# Patient Record
Sex: Female | Born: 1995 | Race: White | Hispanic: No | Marital: Single | Smoking: Never smoker
Health system: Southern US, Community
[De-identification: ages and names within clinical notes are randomized; demographics above are authoritative.]

## PROBLEM LIST (undated history)

## (undated) DIAGNOSIS — J45909 Unspecified asthma, uncomplicated: Secondary | ICD-10-CM

## (undated) DIAGNOSIS — N946 Dysmenorrhea, unspecified: Secondary | ICD-10-CM

## (undated) DIAGNOSIS — R011 Cardiac murmur, unspecified: Secondary | ICD-10-CM

## (undated) DIAGNOSIS — N83299 Other ovarian cyst, unspecified side: Secondary | ICD-10-CM

## (undated) DIAGNOSIS — N912 Amenorrhea, unspecified: Secondary | ICD-10-CM

## (undated) DIAGNOSIS — T8859XA Other complications of anesthesia, initial encounter: Secondary | ICD-10-CM

## (undated) DIAGNOSIS — E039 Hypothyroidism, unspecified: Secondary | ICD-10-CM

## (undated) DIAGNOSIS — F419 Anxiety disorder, unspecified: Secondary | ICD-10-CM

## (undated) HISTORY — DX: Amenorrhea, unspecified: N91.2

## (undated) HISTORY — DX: Dysmenorrhea, unspecified: N94.6

## (undated) HISTORY — DX: Cardiac murmur, unspecified: R01.1

## (undated) HISTORY — DX: Anxiety disorder, unspecified: F41.9

## (undated) HISTORY — DX: Other ovarian cyst, unspecified side: N83.299

## (undated) HISTORY — DX: Unspecified asthma, uncomplicated: J45.909

---
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
## 2014-04-28 HISTORY — PX: APPENDECTOMY: SHX54

## 2019-02-21 ENCOUNTER — Other Ambulatory Visit: Payer: Self-pay

## 2019-02-23 ENCOUNTER — Encounter: Payer: BC Managed Care – PPO | Admitting: Obstetrics and Gynecology

## 2019-03-07 ENCOUNTER — Other Ambulatory Visit: Payer: Self-pay

## 2019-03-07 ENCOUNTER — Ambulatory Visit (INDEPENDENT_AMBULATORY_CARE_PROVIDER_SITE_OTHER): Payer: BC Managed Care – PPO | Admitting: Obstetrics and Gynecology

## 2019-03-07 ENCOUNTER — Encounter: Payer: Self-pay | Admitting: Obstetrics and Gynecology

## 2019-03-07 VITALS — BP 136/70 | HR 76 | Temp 97.6°F | Resp 16 | Ht 59.0 in | Wt 204.6 lb

## 2019-03-07 DIAGNOSIS — Z01419 Encounter for gynecological examination (general) (routine) without abnormal findings: Secondary | ICD-10-CM | POA: Diagnosis not present

## 2019-03-07 DIAGNOSIS — N941 Unspecified dyspareunia: Secondary | ICD-10-CM

## 2019-03-07 DIAGNOSIS — N760 Acute vaginitis: Secondary | ICD-10-CM | POA: Diagnosis not present

## 2019-03-07 DIAGNOSIS — Z8742 Personal history of other diseases of the female genital tract: Secondary | ICD-10-CM

## 2019-03-07 DIAGNOSIS — Z0000 Encounter for general adult medical examination without abnormal findings: Secondary | ICD-10-CM

## 2019-03-07 DIAGNOSIS — N946 Dysmenorrhea, unspecified: Secondary | ICD-10-CM

## 2019-03-07 LAB — POCT URINALYSIS DIPSTICK
Bilirubin, UA: NEGATIVE
Blood, UA: NEGATIVE
Glucose, UA: NEGATIVE
Ketones, UA: NEGATIVE
Leukocytes, UA: NEGATIVE
Nitrite, UA: NEGATIVE
Protein, UA: NEGATIVE
Urobilinogen, UA: 0.2 E.U./dL
pH, UA: 6 (ref 5.0–8.0)

## 2019-03-07 NOTE — Patient Instructions (Signed)

## 2019-03-07 NOTE — Progress Notes (Signed)
23 y.o. G1P0000 Married Caucasian female here for annual exam.    Patient has history of ovarian cysts and heavy menstrual cycles. She is also complaining of dyspareunia. She has a history of ruptured cysts and was dx with PID at one point.  Her cultures were negative.   She has constant sharp pain in her left side.  She picks up boxes at work, and this makes it worse.  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  She wants a note for work that she cannot do heavy lifting.   She tried birth control but she had a period every other week.  She stopped after 2 - 3 months.   She has painful menses.  She had heavy flow this time that lasted for 7 days.   Occasional skipped menses.   Thinks she may have a yeast infection.   Urine dip:  Neg.   PCP:  None   Patient's last menstrual period was 02/17/2019 (exact date).     Period Cycle (Days): 30 Period Duration (Days): 4-7 days Period Pattern: (!) Irregular Menstrual Flow: Heavy Menstrual Control: Tampon, Maxi pad Menstrual Control Change Freq (Hours): every 30 minutes to 1 hour Dysmenorrhea: (!) Severe Dysmenorrhea Symptoms: Diarrhea, Cramping   Sexually active: Yes.    The current method of family planning is condoms everytime.    Exercising: Yes.    walks daily Smoker:  no  Health Maintenance: Pap:  06/2017 Normal History of abnormal Pap:  no MMG:  n/a Colonoscopy:  n/a BMD:   n/a  Result  n/a TDaP: Unsure Gardasil:   Yes, completed HIV: 10/2018 Neg Hep C:10/2018 Neg Screening Labs:  PCP. Flu vaccine:  Completed last month.    reports that she has never smoked. She has never used smokeless tobacco. She reports that she does not drink alcohol or use drugs.  Past Medical History:  Diagnosis Date  . Amenorrhea   . Anxiety   . Asthma   . Dysmenorrhea   . Functional ovarian cysts   . Heart murmur    functional heart murmur--advised not to give blood    Past Surgical History:  Procedure  Laterality Date  . APPENDECTOMY  2016  . TONSILLECTOMY AND ADENOIDECTOMY  2001    Current Outpatient Medications  Medication Sig Dispense Refill  . albuterol (VENTOLIN HFA) 108 (90 Base) MCG/ACT inhaler Inhale 1 puff into the lungs as needed.    Marland Kitchen EPINEPHrine (EPIPEN 2-PAK) 0.3 mg/0.3 mL IJ SOAJ injection Inject 0.3 mg into the muscle as needed for anaphylaxis.    Marland Kitchen sertraline (ZOLOFT) 100 MG tablet Take 2 tablets by mouth daily.    . traMADol (ULTRAM) 50 MG tablet Take 50 mg by mouth every 6 (six) hours as needed.     No current facility-administered medications for this visit.     Family History  Problem Relation Age of Onset  . Thyroid disease Mother        hypothyroid  . Diabetes Father   . Diabetes Maternal Grandmother   . Cancer Maternal Grandfather 73       colon ca  . Ovarian cancer Paternal Grandmother 44       dec ovarian ca  . Diabetes Paternal Grandfather   . Heart attack Paternal Grandfather   . Breast cancer Other     Review of Systems  All other systems reviewed and are negative.   Exam:   BP 136/70 (Cuff Size: Large)   Pulse 76  Temp 97.6 F (36.4 C) (Temporal)   Resp 16   Ht 4\' 11"  (1.499 m)   Wt 204 lb 9.6 oz (92.8 kg)   LMP 02/17/2019 (Exact Date)   BMI 41.32 kg/m     General appearance: alert, cooperative and appears stated age Head: normocephalic, without obvious abnormality, atraumatic Neck: no adenopathy, supple, symmetrical, trachea midline and thyroid normal to inspection and palpation Lungs: clear to auscultation bilaterally Breasts: normal appearance, no masses or tenderness, No nipple retraction or dimpling, No nipple discharge or bleeding, No axillary adenopathy Heart: regular rate and rhythm Abdomen: soft, non-tender; no masses, no organomegaly Extremities: extremities normal, atraumatic, no cyanosis or edema Skin: skin color, texture, turgor normal. No rashes or lesions Lymph nodes: cervical, supraclavicular, and axillary nodes  normal. Neurologic: grossly normal  Pelvic: External genitalia:  no lesions              No abnormal inguinal nodes palpated.              Urethra:  normal appearing urethra with no masses, tenderness or lesions              Bartholins and Skenes: normal                 Vagina: normal appearing vagina with normal color and discharge, no lesions              Cervix: no lesions              Pap taken: No. Bimanual Exam:  Uterus:  normal size, contour, position, consistency, mobility, non-tender              Adnexa: no mass, fullness, tenderness on right.  Left adnexal tenderness without mass.               Rectal exam: Yes.  .  Confirms.              Anus:  normal sphincter tone, no lesions  Chaperone was present for exam.  Assessment:   Well woman visit with normal exam. Vaginitis. Dyspareunia.  Dysmenorrhea.  Hx ovarian cysts.   Plan: Mammogram screening discussed. Self breast awareness reviewed. Pap and HR HPV as above. Guidelines for Calcium, Vitamin D, regular exercise program including cardiovascular and weight bearing exercise. We discussed painful menses and ovarian cysts.  Nuswab. Return for pelvic 02/19/2019.  Follow up annually and prn.   After visit summary provided.

## 2019-03-08 LAB — NUSWAB VAGINITIS (VG)
Candida albicans, NAA: NEGATIVE
Candida glabrata, NAA: NEGATIVE
Trich vag by NAA: NEGATIVE

## 2019-03-09 ENCOUNTER — Telehealth: Payer: Self-pay | Admitting: Obstetrics and Gynecology

## 2019-03-09 NOTE — Telephone Encounter (Signed)
Call placed to patient to review benefit for recommended ultrasound. Left voicemail message requesting a return call °

## 2019-03-10 NOTE — Telephone Encounter (Signed)
Second call placed to patient to scheduled recommended ultrasound. Left voicemail message requesting a return call

## 2019-03-22 ENCOUNTER — Other Ambulatory Visit: Payer: Self-pay

## 2019-03-22 DIAGNOSIS — Z20822 Contact with and (suspected) exposure to covid-19: Secondary | ICD-10-CM

## 2019-03-22 NOTE — Telephone Encounter (Signed)
Ok to close order for ultrasound.

## 2019-03-22 NOTE — Telephone Encounter (Signed)
Third call placed to patient to review benefit and schedule recommended ultrasound. Left voicemail message requesting a return call   cc: Dr Quincy Simmonds

## 2019-03-23 LAB — NOVEL CORONAVIRUS, NAA: SARS-CoV-2, NAA: NOT DETECTED

## 2019-03-28 NOTE — Telephone Encounter (Signed)
Call placed to patient. Reviewed benefit for recommended ultrasound.  Patient acknowledges understanding of information presented. Patient is scheduled 03/31/2019 with Dr Quincy Simmonds. Patient is aware of the appointment date, arrival time and cancellation policy. No further questions. Will close encounter

## 2019-03-31 ENCOUNTER — Other Ambulatory Visit: Payer: BC Managed Care – PPO | Admitting: Obstetrics and Gynecology

## 2019-03-31 ENCOUNTER — Other Ambulatory Visit: Payer: BC Managed Care – PPO

## 2019-04-04 ENCOUNTER — Telehealth: Payer: Self-pay | Admitting: Obstetrics and Gynecology

## 2019-04-04 NOTE — Telephone Encounter (Signed)
Patient cancelled ultrasound for Thursday 12/10 because of work schedule. Would like a call to reschedule.

## 2019-04-04 NOTE — Telephone Encounter (Signed)
Attempted to call back pt to r/s PUS. Pts voicemail box full and cannot leave message. Will try again.

## 2019-04-05 NOTE — Telephone Encounter (Signed)
Spoke to pt. Pt needing to r/s PUS due to work schedule and pt cannot be seen until Jan 2021. PUS/OV rescheduled on 05/12/2019 at 8:30am with Dr Quincy Simmonds. Pt agreeable.   Will route to Dr Quincy Simmonds for review and close encounter.   Cc: Deloris Ping, orders in place, precert moved from 62/86 and pt aware of PR.

## 2019-04-07 ENCOUNTER — Other Ambulatory Visit: Payer: BC Managed Care – PPO

## 2019-04-07 ENCOUNTER — Other Ambulatory Visit: Payer: BC Managed Care – PPO | Admitting: Obstetrics and Gynecology

## 2019-04-29 DIAGNOSIS — U071 COVID-19: Secondary | ICD-10-CM

## 2019-04-29 HISTORY — DX: COVID-19: U07.1

## 2019-05-09 ENCOUNTER — Telehealth: Payer: Self-pay | Admitting: Obstetrics and Gynecology

## 2019-05-09 NOTE — Telephone Encounter (Signed)
Patient is calling to move time of [place] appointment on Thursday.

## 2019-05-09 NOTE — Telephone Encounter (Signed)
Spoke with patient. Patient request to change the time of PUS scheduled for 05/12/19 d/t her work schedule. PUS r/s to 05/12/19 at 11am, consult at 11:30am. Patient verbalizes understanding and is agreeable.   Routing to provider for final review. Patient is agreeable to disposition. Will close encounter.

## 2019-05-10 ENCOUNTER — Other Ambulatory Visit: Payer: Self-pay

## 2019-05-12 ENCOUNTER — Ambulatory Visit: Payer: BC Managed Care – PPO | Admitting: Obstetrics and Gynecology

## 2019-05-12 ENCOUNTER — Other Ambulatory Visit: Payer: Self-pay

## 2019-05-12 ENCOUNTER — Ambulatory Visit: Payer: Self-pay | Admitting: Obstetrics and Gynecology

## 2019-05-12 ENCOUNTER — Encounter: Payer: Self-pay | Admitting: Obstetrics and Gynecology

## 2019-05-12 ENCOUNTER — Ambulatory Visit (INDEPENDENT_AMBULATORY_CARE_PROVIDER_SITE_OTHER): Payer: BC Managed Care – PPO

## 2019-05-12 VITALS — BP 108/70 | HR 56 | Temp 97.1°F | Ht 59.0 in | Wt 203.8 lb

## 2019-05-12 DIAGNOSIS — Z8742 Personal history of other diseases of the female genital tract: Secondary | ICD-10-CM | POA: Diagnosis not present

## 2019-05-12 DIAGNOSIS — N946 Dysmenorrhea, unspecified: Secondary | ICD-10-CM | POA: Diagnosis not present

## 2019-05-12 DIAGNOSIS — N920 Excessive and frequent menstruation with regular cycle: Secondary | ICD-10-CM | POA: Diagnosis not present

## 2019-05-12 DIAGNOSIS — N941 Unspecified dyspareunia: Secondary | ICD-10-CM

## 2019-05-12 MED ORDER — LEVONORGEST-ETH ESTRAD 91-DAY 0.15-0.03 MG PO TABS: 1.0000 | ORAL_TABLET | Freq: Every day | ORAL | 0 refills | Status: DC

## 2019-05-12 NOTE — Progress Notes (Signed)
Encounter reviewed by Dr. Keamber Macfadden Amundson C. Silva.  

## 2019-05-12 NOTE — Patient Instructions (Signed)
Endometriosis  Endometriosis is a condition in which the tissue that lines the uterus (endometrium) grows outside of its normal location. The tissue may grow in many locations close to the uterus, but it commonly grows on the ovaries, fallopian tubes, vagina, or bowel. When the uterus sheds the endometrium every menstrual cycle, there is bleeding wherever the endometrial tissue is located. This can cause pain because blood is irritating to tissues that are not normally exposed to it. What are the causes? The cause of endometriosis is not known. What increases the risk? You may be more likely to develop endometriosis if you:  Have a family history of endometriosis.  Have never given birth.  Started your period at age 10 or younger.  Have high levels of estrogen in your body.  Were exposed to a certain medicine (diethylstilbestrol) before you were born (in utero).  Had low birth weight.  Were born as a twin, triplet, or other multiple.  Have a BMI of less than 25. BMI is an estimate of body fat and is calculated from height and weight. What are the signs or symptoms? Often, there are no symptoms of this condition. If you do have symptoms, they may:  Vary depending on where your endometrial tissue is growing.  Occur during your menstrual period (most common) or midcycle.  Come and go, or you may go months with no symptoms at all.  Stop with menopause. Symptoms may include:  Pain in the back or abdomen.  Heavier bleeding during periods.  Pain during sex.  Painful bowel movements.  Infertility.  Pelvic pain.  Bleeding more than once a month. How is this diagnosed? This condition is diagnosed based on your symptoms and a physical exam. You may have tests, such as:  Blood tests and urine tests. These may be done to help rule out other possible causes of your symptoms.  Ultrasound, to look for abnormal tissues.  An X-ray of the lower bowel (barium enema).  An  ultrasound that is done through the vagina (transvaginally).  CT scan.  MRI.  Laparoscopy. In this procedure, a lighted, pencil-sized instrument called a laparoscope is inserted into your abdomen through an incision. The laparoscope allows your health care provider to look at the organs inside your body and check for abnormal tissue to confirm the diagnosis. If abnormal tissue is found, your health care provider may remove a small piece of tissue (biopsy) to be examined under a microscope. How is this treated? Treatment for this condition may include:  Medicines to relieve pain, such as NSAIDs.  Hormone therapy. This involves using artificial (synthetic) hormones to reduce endometrial tissue growth. Your health care provider may recommend using a hormonal form of birth control, or other medicines.  Surgery. This may be done to remove abnormal endometrial tissue. ? In some cases, tissue may be removed using a laparoscope and a laser (laparoscopic laser treatment). ? In severe cases, surgery may be done to remove the fallopian tubes, uterus, and ovaries (hysterectomy). Follow these instructions at home:  Take over-the-counter and prescription medicines only as told by your health care provider.  Do not drive or use heavy machinery while taking prescription pain medicine.  Try to avoid activities that cause pain, including sexual activity.  Keep all follow-up visits as told by your health care provider. This is important. Contact a health care provider if:  You have pain in the area between your hip bones (pelvic area) that occurs: ? Before, during, or after your period. ?   In between your period and gets worse during your period. ? During or after sex. ? With bowel movements or urination, especially during your period.  You have problems getting pregnant.  You have a fever. Get help right away if:  You have severe pain that does not get better with medicine.  You have severe  nausea and vomiting, or you cannot eat without vomiting.  You have pain that affects only the lower, right side of your abdomen.  You have abdominal pain that gets worse.  You have abdominal swelling.  You have blood in your stool. This information is not intended to replace advice given to you by your health care provider. Make sure you discuss any questions you have with your health care provider. Document Revised: 03/27/2017 Document Reviewed: 09/15/2015 Elsevier Patient Education  2020 [employer]. Ethinyl Estradiol; Levonorgestrel tablets What is this medicine? ETHINYL ESTRADIOL; LEVONORGESTREL (ETH in il es tra DYE ole; LEE voh nor jes trel) is an oral contraceptive. It combines two types of female hormones, an estrogen and a progestin. They are used to prevent ovulation and pregnancy. This medicine may be used for other purposes; ask your health care provider or pharmacist if you have questions. COMMON BRAND NAME(S): [place], [place], [place], [place], Amethia Lo, [place], [place], [place], [place], [place], [place], Cam[place], [place], [place], [place], [address], [place], [place], [place], [place], [place], [place], [place], [place], [place], [place], [place], [place], Levonorgestrel/Ethinyl Estradiol, [place], [place], [place], [place], [place], [place], [place], [place], [place], [place], [place], [place], [place], [place], [place], [place], [place], [place], [place], [place], [place], [place] What should I tell my health care provider before I take this medicine? They need to know if you have or ever had any of these conditions:  abnormal vaginal bleeding  blood vessel disease or blood clots  breast, cervical, endometrial, ovarian, liver, or uterine cancer  diabetes  gallbladder disease  heart disease or recent heart attack  high blood pressure  high cholesterol  kidney disease  liver disease  migraine headaches  stroke  systemic  lupus erythematosus (SLE)  tobacco smoker  an unusual or allergic reaction to estrogens, progestins, other medicines, foods, dyes, or preservatives  pregnant or trying to get pregnant  breast-feeding How should I use this medicine? Take this medicine by mouth. To reduce nausea, this medicine may be taken with food. Follow the directions on the prescription label. Take this medicine at the same time each day and in the order directed on the package. Do not take your medicine more often than directed. Contact your pediatrician regarding the use of this medicine in children. Special care may be needed. This medicine has been used in female children who have started having menstrual periods. A patient package insert for the product will be given with each prescription and refill. Read this sheet carefully each time. The sheet may change frequently. Overdosage: If you think you have taken too much of this medicine contact a poison control center or emergency room at once. NOTE: This medicine is only for you. Do not share this medicine with others. What if I miss a dose? If you miss a dose, refer to the patient information sheet you received with your medicine for direction. If you miss more than one pill, this medicine may not be as effective and you may need to use another form of birth control. What may interact with this medicine? Do not take this medicine with the following medication:  dasabuvir; ombitasvir; paritaprevir; ritonavir  ombitasvir; paritaprevir; ritonavir This medicine may  also interact with the following medications:  acetaminophen  antibiotics or medicines for infections, especially rifampin, rifabutin, rifapentine, and griseofulvin, and possibly penicillins or tetracyclines  aprepitant  ascorbic acid (vitamin C)  atorvastatin  barbiturate medicines, such as  phenobarbital  bosentan  carbamazepine  caffeine  clofibrate  cyclosporine  dantrolene  doxercalciferol  felbamate  grapefruit juice  hydrocortisone  medicines for anxiety or sleeping problems, such as diazepam or temazepam  medicines for diabetes, including pioglitazone  mineral oil  modafinil  mycophenolate  nefazodone  oxcarbazepine  phenytoin  prednisolone  ritonavir or other medicines for HIV infection or AIDS  rosuvastatin  selegiline  soy isoflavones supplements  St. John's wort  tamoxifen or raloxifene  theophylline  thyroid hormones  topiramate  warfarin This list may not describe all possible interactions. Give your health care provider a list of all the medicines, herbs, non-prescription drugs, or dietary supplements you use. Also tell them if you smoke, drink alcohol, or use illegal drugs. Some items may interact with your medicine. What should I watch for while using this medicine? Visit your doctor or health care professional for regular checks on your progress. You will need a regular breast and pelvic exam and Pap smear while on this medicine. Use an additional method of contraception during the first cycle that you take these tablets. If you have any reason to think you are pregnant, stop taking this medicine right away and contact your doctor or health care professional. If you are taking this medicine for hormone related problems, it may take several cycles of use to see improvement in your condition. Smoking increases the risk of getting a blood clot or having a stroke while you are taking birth control pills, especially if you are more than 26 years old. You are strongly advised not to smoke. This medicine can make your body retain fluid, making your fingers, hands, or ankles swell. Your blood pressure can go up. Contact your doctor or health care professional if you feel you are retaining fluid. This medicine can make you more  sensitive to the sun. Keep out of the sun. If you cannot avoid being in the sun, wear protective clothing and use sunscreen. Do not use sun lamps or tanning beds/booths. If you wear contact lenses and notice visual changes, or if the lenses begin to feel uncomfortable, consult your eye care specialist. In some women, tenderness, swelling, or minor bleeding of the gums may occur. Notify your dentist if this happens. Brushing and flossing your teeth regularly may help limit this. See your dentist regularly and inform your dentist of the medicines you are taking. If you are going to have elective surgery, you may need to stop taking this medicine before the surgery. Consult your health care professional for advice. This medicine does not protect you against HIV infection (AIDS) or any other sexually transmitted diseases. What side effects may I notice from receiving this medicine? Side effects that you should report to your doctor or health care professional as soon as possible:  breast tissue changes or discharge  changes in vaginal bleeding during your period or between your periods  chest pain  coughing up blood  dizziness or fainting spells  headaches or migraines  leg, arm or groin pain  severe or sudden headaches  stomach pain (severe)  sudden shortness of breath  sudden loss of coordination, especially on one side of the body  speech problems  symptoms of vaginal infection like itching, irritation or unusual discharge  tenderness in the upper abdomen  vomiting  weakness or numbness in the arms or legs, especially on one side of the body  yellowing of the eyes or skin Side effects that usually do not require medical attention (report to your doctor or health care professional if they continue or are bothersome):  breakthrough bleeding and spotting that continues beyond the 3 initial cycles of pills  breast tenderness  mood changes, anxiety, depression, frustration,  anger, or emotional outbursts  increased sensitivity to sun or ultraviolet light  nausea  skin rash, acne, or brown spots on the skin  weight gain (slight) This list may not describe all possible side effects. Call your doctor for medical advice about side effects. You may report side effects to FDA at 1-800-FDA-1088. Where should I keep my medicine? Keep out of the reach of children. Store at room temperature between 15 and 30 degrees C (59 and 86 degrees F). Throw away any unused medicine after the expiration date. NOTE: This sheet is a summary. It may not cover all possible information. If you have questions about this medicine, talk to your doctor, pharmacist, or health care provider.  2020 Elsevier/Gold Standard (2015-12-24 07:58:22)

## 2019-05-12 NOTE — Progress Notes (Signed)
GYNECOLOGY  VISIT   HPI: 24 y.o.   Married  Caucasian  female   G0P0000 with Patient's last menstrual period was 04/22/2019 (exact date).   here for pelvic ultrasound.   Heavy menstrual cycles with ongoing dysmenorrhea and dyspareunia for 3 months and not every time she has intercourse. Hx ovarian cysts.  Changing pad and tampon every 45 minutes to one hour.   Recently having left sided pain, increased with activity.  Always having left sided pressure.   No constipation or blood in her urine.  She has some back pain.   She has used birth control pills in past.  Denies HTN, migraine aura, liver or breast disease, personal or FH of thromboembolic events.   GYNECOLOGIC HISTORY: Patient's last menstrual period was 04/22/2019 (exact date). Contraception: condoms everytime Menopausal hormone therapy: none Last mammogram:  n/a Last pap smear: 06/2017 normal        OB History    Gravida  0   Para  0   Term  0   Preterm  0   AB  0   Living  0     SAB  0   TAB  0   Ectopic  0   Multiple  0   Live Births  0              There are no problems to display for this patient.   Past Medical History:  Diagnosis Date  . Amenorrhea   . Anxiety   . Asthma   . Dysmenorrhea   . Functional ovarian cysts   . Heart murmur    functional heart murmur--advised not to give blood    Past Surgical History:  Procedure Laterality Date  . APPENDECTOMY  2016  . TONSILLECTOMY AND ADENOIDECTOMY  2001    Current Outpatient Medications  Medication Sig Dispense Refill  . albuterol (VENTOLIN HFA) 108 (90 Base) MCG/ACT inhaler Inhale 1 puff into the lungs as needed.    Marland Kitchen EPINEPHrine (EPIPEN 2-PAK) 0.3 mg/0.3 mL IJ SOAJ injection Inject 0.3 mg into the muscle as needed for anaphylaxis.    Marland Kitchen sertraline (ZOLOFT) 100 MG tablet Take 2 tablets by mouth daily.     No current facility-administered medications for this visit.     ALLERGIES: Peanut oil, Penicillins, Latex, and  Other  Family History  Problem Relation Age of Onset  . Thyroid disease Mother        hypothyroid  . Diabetes Father   . Diabetes Maternal Grandmother   . Cancer Maternal Grandfather 31       colon ca  . Ovarian cancer Paternal Grandmother 44       dec ovarian ca  . Diabetes Paternal Grandfather   . Heart attack Paternal Grandfather   . Breast cancer Other     Social History   Socioeconomic History  . Marital status: Married    Spouse name: Not on file  . Number of children: Not on file  . Years of education: Not on file  . Highest education level: Not on file  Occupational History  . Not on file  Tobacco Use  . Smoking status: Never Smoker  . Smokeless tobacco: Never Used  Substance and Sexual Activity  . Alcohol use: Never  . Drug use: Never  . Sexual activity: Yes    Birth control/protection: Condom    Comment: condoms everytime  Other Topics Concern  . Not on file  Social History Narrative  . Not on file  Social Determinants of Health   Financial Resource Strain:   . Difficulty of Paying Living Expenses: Not on file  Food Insecurity:   . Worried About Programme researcher, broadcasting/film/video in the Last Year: Not on file  . Ran Out of Food in the Last Year: Not on file  Transportation Needs:   . Lack of Transportation (Medical): Not on file  . Lack of Transportation (Non-Medical): Not on file  Physical Activity:   . Days of Exercise per Week: Not on file  . Minutes of Exercise per Session: Not on file  Stress:   . Feeling of Stress : Not on file  Social Connections:   . Frequency of Communication with Friends and Family: Not on file  . Frequency of Social Gatherings with Friends and Family: Not on file  . Attends Religious Services: Not on file  . Active Member of Clubs or Organizations: Not on file  . Attends Banker Meetings: Not on file  . Marital Status: Not on file  Intimate Partner Violence:   . Fear of Current or Ex-Partner: Not on file  .  Emotionally Abused: Not on file  . Physically Abused: Not on file  . Sexually Abused: Not on file    Review of Systems  All other systems reviewed and are negative.   PHYSICAL EXAMINATION:    BP 108/70 (Cuff Size: Large)   Pulse (!) 56 Comment: irregular  Temp (!) 97.1 F (36.2 C) (Temporal)   Ht 4\' 11"  (1.499 m)   Wt 203 lb 12.8 oz (92.4 kg)   LMP 04/22/2019 (Exact Date)   BMI 41.16 kg/m     General appearance: alert, cooperative and appears stated age   Pelvic 04/24/2019 Uterus no masses. EMS 10.64 mm.  Small left CL.  Right ovary normal. No free fluid.  ASSESSMENT  Dysmenorrhea.  Dyspareunia.  Menorrhagia.   PLAN  We discussed her pain and heavy menstrual bleeding and reassuring US findings.  Endometriosis reviewed as possible etiology.  Reassuring US findings conveyed to patient.  Will start Seasonale.  Instructed in use.  FU in 3 months.    An After Visit Summary was printed and given to the patient.  __20____ minutes face to face time of which over 50% was spent in counseling.

## 2019-05-30 ENCOUNTER — Telehealth: Payer: Self-pay | Admitting: Obstetrics and Gynecology

## 2019-05-30 NOTE — Telephone Encounter (Signed)
Spoke with patient. Patient started new OCP 1 wk ago, reports nausea and vomiting after taking medication. She works 3rd shift, takes the medication after work in the morning, takes with food. Has taken OCP in the past with no problems.   Patient asking if ok to take dramamine or other alternative Rx to help with nausea as her body adjust?   Advised nausea typically resolves with use. If nausea does not resolve, need to return call to advise, may need to discuss alternative contraceptive. Advised I will review with Dr. Edward Jolly and return call with additional recommendations. Patient agreeable.  Routing to Dr. Edward Jolly to review.

## 2019-05-30 NOTE — Telephone Encounter (Signed)
Patient is nauseas since staring birth control. She is asking for an a prescription to help this problem.

## 2019-05-30 NOTE — Telephone Encounter (Signed)
Spoke with patient. Advised per Dr. Edward Jolly. Patient declines new Rx at this time. She would like to give current RX a little more time. Patient will return call to office for new Rx if nausea does not resolve.   Routing to provider for final review. Patient is agreeable to disposition. Will close encounter.

## 2019-05-30 NOTE — Telephone Encounter (Signed)
If she is really concerned about the nausea, she can switch to [facility], which is a low dose version of Seasonale.  This pill also gives a small dose of estrogen during the period week which can help some women to avoid menstrual headaches, if they suffer from that.

## 2019-06-06 ENCOUNTER — Telehealth: Payer: Self-pay | Admitting: Obstetrics and Gynecology

## 2019-06-06 NOTE — Telephone Encounter (Signed)
I agree with your recommendations.  Keep 3 month recheck.

## 2019-06-06 NOTE — Telephone Encounter (Signed)
Spoke to pt. Pt states having cramps and spotting( light brown) on Friday and Monday. Pt was SA on Friday. Pt denies any missed or skipped pills. Pt states not taking at same time every day. Discussed plan to take when she can stay on a schedule and take every day. Pt agreeable. Will give new OCPs and pill pack that she started on 05/26/2019 a little more time. Pt denies nausea from update on 05/30/2019 phone encounter. Pt agreeable. Pt has 3 month f/u on OCPs on 08/10/2019.   Routing to Dr Edward Jolly for any additional recommendations or advice.

## 2019-06-06 NOTE — Telephone Encounter (Signed)
Patient is calling regarding irregular bleeding on OCP.

## 2019-06-06 NOTE — Telephone Encounter (Signed)
Encounter closed

## 2019-06-15 ENCOUNTER — Telehealth: Payer: Self-pay | Admitting: Obstetrics and Gynecology

## 2019-06-15 NOTE — Telephone Encounter (Signed)
Spoke with patient. Patient reports constant sharp, burning pain in lower abdomen/pelvis, "more severe last night", currently 6/10. Not relieved with motrin.  Nausea, no vomiting. Spotting 2 days ago and with intercourse. Started seasonale last month, just completed 1st pack. Has had to leave work x3 due to pain. Denies fever/chills.  Patient is requesting FMLA.   Advised patient she would need an OV for further evaluation, would further discuss tx options and plan of care with Dr. Edward Jolly.   Patient reports runny nose and congestion for 2 days, taking a decongestant. Advised patient because of current Covid 19 restrictions she will need to be further evaluated by PCP or Covid 19 testing prior to OV. Patient request MyChart visit, advised patient she will need to be seen in person for appropriate evaluation of symptoms. Will review with Dr. Edward Jolly and return call. Patient is agreeable.

## 2019-06-15 NOTE — Telephone Encounter (Signed)
Patient left her work yesterday due to pain with her endometriosis. She is asking if Dr.Silva would authorize FMLA for her?

## 2019-06-15 NOTE — Telephone Encounter (Signed)
Call reviewed with Dr. Edward Jolly, agreeable to plan.   Call returned to patient, advised as seen above. Patient states she is now scheduled to see her PCP 2/18. She will return call to office for appt.   Routing to provider for final review. Patient is agreeable to disposition. Will close encounter.

## 2019-08-08 ENCOUNTER — Telehealth: Payer: Self-pay | Admitting: Obstetrics and Gynecology

## 2019-08-08 NOTE — Telephone Encounter (Signed)
Call patient regarding cancelled appointment. No answer and mailbox is full. °

## 2019-08-10 ENCOUNTER — Other Ambulatory Visit: Payer: Self-pay

## 2019-08-10 ENCOUNTER — Encounter: Payer: Self-pay | Admitting: Obstetrics & Gynecology

## 2019-08-10 ENCOUNTER — Ambulatory Visit: Payer: BC Managed Care – PPO | Admitting: Obstetrics and Gynecology

## 2019-08-10 ENCOUNTER — Ambulatory Visit: Payer: BC Managed Care – PPO | Admitting: Obstetrics & Gynecology

## 2019-08-10 VITALS — BP 122/70 | HR 80 | Temp 97.9°F | Resp 10 | Ht 59.0 in | Wt 204.0 lb

## 2019-08-10 DIAGNOSIS — N941 Unspecified dyspareunia: Secondary | ICD-10-CM

## 2019-08-10 DIAGNOSIS — N946 Dysmenorrhea, unspecified: Secondary | ICD-10-CM

## 2019-08-10 MED ORDER — LEVONORGEST-ETH ESTRAD 91-DAY 0.15-0.03 MG PO TABS: 1.0000 | ORAL_TABLET | Freq: Every day | ORAL | 2 refills | Status: DC

## 2019-08-10 NOTE — Progress Notes (Signed)
GYNECOLOGY  VISIT  CC:   Patient complains of still having pain with and without periods and abdominal swelling  HPI: 24 y.o. G0P0000 Married White or Caucasian female here for 3 month OCP recheck.  She's been followed by Dr. Quincy Simmonds for dysmenorrhea and possible endometriosis.  She had a normal ultrasound 05/12/2019.  She was started on seasonale and reports she's done well taking the medication but did have a lot of irregular bleeding the first month.  She passed tissue and clots with some of the bleeding.  She did have a cycle last month and missed two weeks of work due to pelvic pain at that time.  Pain is typically located in the LLQ.  Reports she always has dull pain but it was much more severe in march.  The dull pain is more like a "burning pain" but when it is severe it is sharper in nature.  She also has a lot of bloating when she has pain.     Does not have constipation or diarrhea.  Has bowel movement every day.  Does not have pain with bowel movements.    Pt has hx of painful intercourse as well but did not this was improved the last month.  This was a nice surprise to her.  She is not currently interested in childbearing but does want to be able to consider this in the future.  Her boyfriend would like kids as well.    Pt is ready to proceed with additional step of evaluation.  She knows Dr. Quincy Simmonds is not available today but is ready to proceed with laparoscopy.  We discussed this surgical procedure in the evaluation for endometriosis including risks and benefits.  Need for tissue biopsy discussed.  As well, discussed need for treatment after procedure if endometriosis is present.  Will need to wait until Dr. Quincy Simmonds is back to proceed with any scheduling.    Pt does want to stay on the OCP for now.    GYNECOLOGIC HISTORY: Patient's last menstrual period was 06/19/2019 (within days). Contraception: Seasonale Menopausal hormone therapy: none  There are no problems to display for this  patient.   Past Medical History:  Diagnosis Date  . Amenorrhea   . Anxiety   . Asthma   . Dysmenorrhea   . Functional ovarian cysts   . Heart murmur    functional heart murmur--advised not to give blood    Past Surgical History:  Procedure Laterality Date  . APPENDECTOMY  2016  . TONSILLECTOMY AND ADENOIDECTOMY  2001    MEDS:   Current Outpatient Medications on File Prior to Visit  Medication Sig Dispense Refill  . albuterol (VENTOLIN HFA) 108 (90 Base) MCG/ACT inhaler Inhale 1 puff into the lungs as needed.    Marland Kitchen EPINEPHrine (EPIPEN 2-PAK) 0.3 mg/0.3 mL IJ SOAJ injection Inject 0.3 mg into the muscle as needed for anaphylaxis.    Marland Kitchen levonorgestrel-ethinyl estradiol (SEASONALE) 0.15-0.03 MG tablet Take 1 tablet by mouth daily. 1 Package 0  . sertraline (ZOLOFT) 100 MG tablet Take 2 tablets by mouth daily.     No current facility-administered medications on file prior to visit.    ALLERGIES: Peanut oil, Penicillins, Latex, and Other  Family History  Problem Relation Age of Onset  . Thyroid disease Mother        hypothyroid  . Diabetes Father   . Diabetes Maternal Grandmother   . Cancer Maternal Grandfather 73       colon ca  . Ovarian cancer  Paternal Grandmother 101       dec ovarian ca  . Diabetes Paternal Grandfather   . Heart attack Paternal Grandfather   . Breast cancer Other     SH:  Single, non smoker  Review of Systems  All other systems reviewed and are negative.   PHYSICAL EXAMINATION:    BP 122/70 (BP Location: Left Arm, Patient Position: Sitting, Cuff Size: Normal)   Pulse 80   Temp 97.9 F (36.6 C) (Temporal)   Resp 10   Ht 4\' 11"  (1.499 m)   Wt 204 lb (92.5 kg)   LMP 06/19/2019 (Within Days)   BMI 41.20 kg/m     General appearance: alert, cooperative and appears stated age  Assessment: Pelvic pain Dysmenorrhea  Plan: Continue Seasonale.  rx to pharmacy. Consider proceeding with laparoscopy for further evaluation.  Will wait until Dr.  06/21/2019 returns.  About 30 minutes spent with pt in face to face discussion.

## 2019-08-17 ENCOUNTER — Telehealth: Payer: Self-pay | Admitting: Obstetrics and Gynecology

## 2019-08-17 NOTE — Telephone Encounter (Signed)
Spoke with patient regarding surgery benefits. Patient acknowledges understanding of information presented. Patient is aware that benefits presented are for professional benefits only. Patient is aware that once surgery is scheduled, the hospital will call with separate benefits. Patient is aware of surgery cancellation policy.  Patient stated that she would speak with her employer this evening and call tomorrow to proceed with scheduling. Patient aware to ask to speak to [facility] regarding scheduling.

## 2019-08-29 ENCOUNTER — Encounter: Payer: Self-pay | Admitting: Obstetrics and Gynecology

## 2019-08-29 NOTE — Telephone Encounter (Addendum)
Call to patient to follow up regarding scheduling surgery. Patient stated that she tested positive for COVID-19 on 09/22/2019. Patient stated that she has asthma and has had a really hard time with COVID symptoms. Patient stated that she has another COVID-19 test scheduled for 09/09/2019 before returning to work. Patient requests a call back on 09/12/2019 to see how she is feeling and how she would like to proceed. Patient still wants to proceed with surgery, but is unsure about time frame at this time. Patient stated that she wants Dr. Edward Jolly to be aware that when she has been put under anesthesia in the past, she has had issues with her breathing as she comes out of sedation. Informed patient that I would let Dr. Edward Jolly know and would return call on 09/12/2019. Patient is agreeable.  Routing to Dr. Edward Jolly and Yvonna Alanis, RN.

## 2019-08-29 NOTE — Telephone Encounter (Signed)
I recommend patient follow up with her PCP or maybe a pulmonologist if she is having respiratory difficulties.  For surgery preparation, I would recommend an anesthesia consultation.   Cc - Rolly Salter Carder

## 2019-09-07 NOTE — Telephone Encounter (Signed)
Call to patient, no answer, unable to leave message.

## 2019-09-13 NOTE — Telephone Encounter (Signed)
Call to patient, no answer, unable to leave message.

## 2019-09-15 NOTE — Telephone Encounter (Signed)
Dr.Silva, we have been unable to reach this patient. Please advise.

## 2019-09-16 NOTE — Telephone Encounter (Signed)
OK to wait for patient to contact the office back when she is ready to proceed.  The indication for surgery is pain.   You may close the encounter.

## 2019-10-17 ENCOUNTER — Telehealth: Payer: Self-pay

## 2019-10-17 NOTE — Telephone Encounter (Signed)
Patient is calling for questions regarding surgery and insurance.

## 2019-10-18 NOTE — Telephone Encounter (Signed)
Left message to return call to Hayley at [phone] 

## 2019-10-19 NOTE — Telephone Encounter (Signed)
Patient is calling regarding proceeding with surgery plan. Informed patient that benefits would need to be checked again. Patient is agreeable and aware to expect return call regarding updated benefits.  Dr. Edward Jolly, please confirm surgery plan for patient.

## 2019-10-21 ENCOUNTER — Telehealth: Payer: Self-pay

## 2019-10-21 NOTE — Telephone Encounter (Signed)
Patient is calling in regards to having endometriosis and is "having chunks of tissue come out".

## 2019-10-21 NOTE — Telephone Encounter (Signed)
Spoke with pt. Pt states having cramping and LLQ pain same as before in April when she saw Dr Hyacinth Meeker. Pt states missing work due to pain.  Pt states having "chunks of brown tissue" come out x 2 days now with cramping. Pt denies any vaginal bleeding.  Pt states taking Seasonale Rx with no skipped or missed pills. LMP 08/21/19. Next cycle to start in 11/20/19. Denies any fever, chills, BM changes. Takes Ibuprofen 600mg  a day for pain everyday and has used heating pad intermittently that helps some.  Pt waiting to schedule surgery due to cost.  Advised will review with provider in office and return call to pt. pt agreeable.

## 2019-10-21 NOTE — Telephone Encounter (Signed)
Please have patient do a pregnancy test if she is sexually active and report back if the test is positive.  I would like to see her for an office visit to discuss options for care - nonsurgical and surgical.

## 2019-10-21 NOTE — Telephone Encounter (Signed)
Left message to return call to Hayley at [phone] 

## 2019-10-21 NOTE — Telephone Encounter (Signed)
Please have patient schedule an appointment with me so I can help best.

## 2019-10-21 NOTE — Telephone Encounter (Signed)
Left message for pt to return call to triage RN. 

## 2019-10-25 NOTE — Telephone Encounter (Signed)
Spoke with patient regarding scheduling a surgery consult with Dr. Edward Jolly. Patient understands and is agreeable. Patient scheduled for surgery consult on 10/27/2019 at 1000AM with Dr. Edward Jolly.  Routing to Dr. Edward Jolly as Lorain Childes. Encounter closed.

## 2019-10-26 NOTE — Progress Notes (Signed)
GYNECOLOGY  VISIT   HPI: 24 y.o.   Married  Caucasian  female   G0P0000 with Patient's last menstrual period was 08/22/2019.   here for discussion of treatment options for dysmenorrhea.  Normal pelvic US on 05/12/19.  States her pain has gotten worse.  Not able to have intercourse for 2 months due to pain.   She has random sharp pains more on the left than on the right.  Her pain extends into her vagina.  No pain into the rectum.  She feels a lot of pressure in her abdomen.   Cramping is controlled with the Seasonale.  She has a period once every 3 months.  She passed some bloody mucous and had pain when she passed this 8 days ago.  She missed work due to pain and bleeding.  She has FLMA at work.   Her pain is causing a strain on her relationship now.   She had STD testing less than one year ago, prior to her first visit here.  She was seen at [facility] and had testing then.   Not sure that she is ready for the commitment of surgery.  GYNECOLOGIC HISTORY: Patient's last menstrual period was 08/22/2019. Contraception: Seasonale, condoms every time.  Menopausal hormone therapy:  n/a Last mammogram:  Last pap smear:  06/2017 normal        OB History    Gravida  0   Para  0   Term  0   Preterm  0   AB  0   Living  0     SAB  0   TAB  0   Ectopic  0   Multiple  0   Live Births  0              There are no problems to display for this patient.   Past Medical History:  Diagnosis Date  . Amenorrhea   . Anxiety   . Asthma   . COVID-19 2021  . Dysmenorrhea   . Functional ovarian cysts   . Heart murmur    functional heart murmur--advised not to give blood    Past Surgical History:  Procedure Laterality Date  . APPENDECTOMY  2016  . TONSILLECTOMY AND ADENOIDECTOMY  2001    Current Outpatient Medications  Medication Sig Dispense Refill  . albuterol (VENTOLIN HFA) 108 (90 Base) MCG/ACT inhaler Inhale 1 puff into the lungs as needed.    Marland Kitchen  EPINEPHrine (EPIPEN 2-PAK) 0.3 mg/0.3 mL IJ SOAJ injection Inject 0.3 mg into the muscle as needed for anaphylaxis.    Marland Kitchen levonorgestrel-ethinyl estradiol (SEASONALE) 0.15-0.03 MG tablet Take 1 tablet by mouth daily. 1 Package 2  . sertraline (ZOLOFT) 100 MG tablet Take 2 tablets by mouth daily.     No current facility-administered medications for this visit.     ALLERGIES: Peanut oil, Penicillins, Latex, and Other  Family History  Problem Relation Age of Onset  . Thyroid disease Mother        hypothyroid  . Diabetes Father   . Diabetes Maternal Grandmother   . Cancer Maternal Grandfather 26       colon ca  . Ovarian cancer Paternal Grandmother 13       dec ovarian ca  . Diabetes Paternal Grandfather   . Heart attack Paternal Grandfather   . Breast cancer Other     Social History   Socioeconomic History  . Marital status: Married    Spouse name: Not on file  .  Number of children: Not on file  . Years of education: Not on file  . Highest education level: Not on file  Occupational History  . Not on file  Tobacco Use  . Smoking status: Never Smoker  . Smokeless tobacco: Never Used  Vaping Use  . Vaping Use: Never used  Substance and Sexual Activity  . Alcohol use: Never  . Drug use: Never  . Sexual activity: Yes    Birth control/protection: Condom, Pill    Comment: condoms everytime  Other Topics Concern  . Not on file  Social History Narrative  . Not on file   Social Determinants of Health   Financial Resource Strain:   . Difficulty of Paying Living Expenses:   Food Insecurity:   . Worried About Programme researcher, broadcasting/film/video in the Last Year:   . Barista in the Last Year:   Transportation Needs:   . Freight forwarder (Medical):   Marland Kitchen Lack of Transportation (Non-Medical):   Physical Activity:   . Days of Exercise per Week:   . Minutes of Exercise per Session:   Stress:   . Feeling of Stress :   Social Connections:   . Frequency of Communication with  Friends and Family:   . Frequency of Social Gatherings with Friends and Family:   . Attends Religious Services:   . Active Member of Clubs or Organizations:   . Attends Banker Meetings:   Marland Kitchen Marital Status:   Intimate Partner Violence:   . Fear of Current or Ex-Partner:   . Emotionally Abused:   Marland Kitchen Physically Abused:   . Sexually Abused:     Review of Systems  All other systems reviewed and are negative.   PHYSICAL EXAMINATION:    BP (!) 142/84 (Cuff Size: Large)   Pulse 68   Ht 4\' 11"  (1.499 m)   Wt 204 lb 9.6 oz (92.8 kg)   LMP 08/22/2019   BMI 41.32 kg/m     General appearance: alert, cooperative and appears stated age  Pelvic: External genitalia:  no lesions              Urethra:  normal appearing urethra with no masses, tenderness or lesions              Bartholins and Skenes: normal                 Vagina: normal appearing vagina with normal color and discharge, no lesions              Cervix: no lesions                Bimanual Exam:  Uterus:  normal size, contour, position, consistency, mobility, non-tender              Adnexa: no mass, fullness, tenderness          Chaperone was present for exam.  ASSESSMENT  Dysmenorrhea.  Dyspareunia.  On COCs, Seasonale.   PLAN  We discussed tx options including Mirena, Depo Provera, 08/24/2019, Depo Lupron.  Risks and benefits reviewed.  She is leaning toward Mirena IUD.  She will continue with her COCs for now.   An After Visit Summary was printed and given to the patient.  ___33___ minute consultation.

## 2019-10-26 NOTE — Telephone Encounter (Signed)
Left message for pt to return call to triage RN. 

## 2019-10-27 ENCOUNTER — Other Ambulatory Visit: Payer: Self-pay

## 2019-10-27 ENCOUNTER — Encounter: Payer: Self-pay | Admitting: Obstetrics and Gynecology

## 2019-10-27 ENCOUNTER — Ambulatory Visit (INDEPENDENT_AMBULATORY_CARE_PROVIDER_SITE_OTHER): Payer: BC Managed Care – PPO | Admitting: Obstetrics and Gynecology

## 2019-10-27 VITALS — BP 142/84 | HR 68 | Ht 59.0 in | Wt 204.6 lb

## 2019-10-27 DIAGNOSIS — R102 Pelvic and perineal pain: Secondary | ICD-10-CM

## 2019-10-27 DIAGNOSIS — N946 Dysmenorrhea, unspecified: Secondary | ICD-10-CM | POA: Diagnosis not present

## 2019-10-27 NOTE — Telephone Encounter (Signed)
Patient returned call

## 2019-10-27 NOTE — Patient Instructions (Signed)
Elagolix tablets What is this medicine? ELAGOLIX (el a GOE lix) is used to treat endometriosis in women. It reduces pain from the condition and may help reduce painful sexual intercourse. This medicine may be used for other purposes; ask your health care provider or pharmacist if you have questions. COMMON BRAND NAME(S): Orilissa What should I tell my health care provider before I take this medicine? They need to know if you have any of these conditions:  liver disease  mental illness  osteoporosis  suicidal thoughts, plans, or attempt; a previous suicide attempt by you or a family member  an unusual or allergic reaction to elagolix, other medicines, foods, dyes, or preservatives  pregnant or trying to get pregnant  breast-feeding How should I use this medicine? Take this medicine by mouth with a glass of water. You can take it with or without food. Follow the directions on the prescription label. Take this medicine at the same time each day. Do not take your medicine more often than directed. A special MedGuide will be given to you by the pharmacist with each prescription and refill. Be sure to read this information carefully each time. Talk to your pediatrician regarding the use of this medicine in children. This medicine is not approved for use in children. Overdosage: If you think you have taken too much of this medicine contact a poison control center or emergency room at once. NOTE: This medicine is only for you. Do not share this medicine with others. What if I miss a dose? If you miss a dose, take it as soon as you can. If it is almost time for your next dose, take only that dose. Do not take double or extra doses. What may interact with this medicine? Do not take this medicine with any of the following medications:  cyclosporine  gemfibrozil This medicine may also interact with the following medications:  certain antiviral medicines for hepatitis, HIV or  AIDS  citalopram  digoxin  female hormones, like estrogens or progestins and birth control pills, patches, rings, or injections  methadone  midazolam  omeprazole  rifampin  rosuvastatin This list may not describe all possible interactions. Give your health care provider a list of all the medicines, herbs, non-prescription drugs, or dietary supplements you use. Also tell them if you smoke, drink alcohol, or use illegal drugs. Some items may interact with your medicine. What should I watch for while using this medicine? Visit your doctor or health care professional for regular checks on your progress. This medicine may cause weak bones (osteoporosis). Only use this product for the amount of time your health care professional tells you to. The longer you use this product the more likely you will be at risk for weak bones. Ask your health care professional how you can keep strong bones. You may have a change in bleeding pattern or irregular periods. Many females stop having periods while taking this drug. This medicine does not prevent pregnancy. Women must use effective birth control with this medicine. Use a non-hormonal form of birth control while taking this medicine and for 1 week after stopping it. Talk to your health care professional about how to prevent pregnancy. Do not become pregnant while taking this medicine. Women should inform their doctor if they wish to become pregnant or think they might be pregnant. There is a potential for serious side effects to an unborn child. Talk to your health care professional or pharmacist for more information. Patients and their families should watch   out for new or worsening depression or thoughts of suicide. Also watch out for sudden changes in feelings such as feeling anxious, agitated, panicky, irritable, hostile, aggressive, impulsive, severely restless, overly excited and hyperactive, or not being able to sleep. If this happens, call your health  care professional. What side effects may I notice from receiving this medicine? Side effects that you should report to your doctor or health care professional as soon as possible:  allergic reactions like skin rash, itching or hives, swelling of the face, lips, or tongue  anxious  depressed mood  signs and symptoms of liver injury like dark yellow or brown urine; general ill feeling or flu-like symptoms; light-colored stools; loss of appetite; nausea; right upper belly pain; unusually weak or tired; yellowing of the eyes or skin  suicidal thoughts or other mood changes Side effects that usually do not require medical attention (report these to your doctor or health care professional if they continue or are bothersome):  reduced or absent menstrual periods  headache  hot flashes or night sweats  nausea  joint pain  trouble sleeping This list may not describe all possible side effects. Call your doctor for medical advice about side effects. You may report side effects to FDA at 1-800-FDA-1088. Where should I keep my medicine? Keep out of the reach of children. Store at room temperature between 2 and 30 degrees C (36 and 86 degrees F). Throw away any unused medicine after the expiration date on the label. Discard any unused medicine and used packaging carefully. Follow the directions in the MedGuide. Do NOT flush down the toilet. NOTE: This sheet is a summary. It may not cover all possible information. If you have questions about this medicine, talk to your doctor, pharmacist, or health care provider.  2020 Elsevier/Gold Standard (2017-12-29 12:46:01) Medroxyprogesterone injection [Contraceptive] What is this medicine? MEDROXYPROGESTERONE (me DROX ee proe JES te rone) contraceptive injections prevent pregnancy. They provide effective birth control for 3 months. Depo-subQ Provera 104 is also used for treating pain related to endometriosis. This medicine may be used for other purposes;  ask your health care provider or pharmacist if you have questions. COMMON BRAND NAME(S): Depo-Provera, Depo-subQ Provera 104 What should I tell my health care provider before I take this medicine? They need to know if you have any of these conditions:  frequently drink alcohol  asthma  blood vessel disease or a history of a blood clot in the lungs or legs  bone disease such as osteoporosis  breast cancer  diabetes  eating disorder (anorexia nervosa or bulimia)  high blood pressure  HIV infection or AIDS  kidney disease  liver disease  mental depression  migraine  seizures (convulsions)  stroke  tobacco smoker  vaginal bleeding  an unusual or allergic reaction to medroxyprogesterone, other hormones, medicines, foods, dyes, or preservatives  pregnant or trying to get pregnant  breast-feeding How should I use this medicine? Depo-Provera Contraceptive injection is given into a muscle. Depo-subQ Provera 104 injection is given under the skin. These injections are given by a health care professional. You must not be pregnant before getting an injection. The injection is usually given during the first 5 days after the start of a menstrual period or 6 weeks after delivery of a baby. Talk to your pediatrician regarding the use of this medicine in children. Special care may be needed. These injections have been used in female children who have started having menstrual periods. Overdosage: If you think you have taken  too much of this medicine contact a poison control center or emergency room at once. NOTE: This medicine is only for you. Do not share this medicine with others. What if I miss a dose? Try not to miss a dose. You must get an injection once every 3 months to maintain birth control. If you cannot keep an appointment, call and reschedule it. If you wait longer than 13 weeks between Depo-Provera contraceptive injections or longer than 14 weeks between Depo-subQ Provera  104 injections, you could get pregnant. Use another method for birth control if you miss your appointment. You may also need a pregnancy test before receiving another injection. What may interact with this medicine? Do not take this medicine with any of the following medications:  bosentan This medicine may also interact with the following medications:  aminoglutethimide  antibiotics or medicines for infections, especially rifampin, rifabutin, rifapentine, and griseofulvin  aprepitant  barbiturate medicines such as phenobarbital or primidone  bexarotene  carbamazepine  medicines for seizures like ethotoin, felbamate, oxcarbazepine, phenytoin, topiramate  modafinil  St. John's wort This list may not describe all possible interactions. Give your health care provider a list of all the medicines, herbs, non-prescription drugs, or dietary supplements you use. Also tell them if you smoke, drink alcohol, or use illegal drugs. Some items may interact with your medicine. What should I watch for while using this medicine? This drug does not protect you against HIV infection (AIDS) or other sexually transmitted diseases. Use of this product may cause you to lose calcium from your bones. Loss of calcium may cause weak bones (osteoporosis). Only use this product for more than 2 years if other forms of birth control are not right for you. The longer you use this product for birth control the more likely you will be at risk for weak bones. Ask your health care professional how you can keep strong bones. You may have a change in bleeding pattern or irregular periods. Many females stop having periods while taking this drug. If you have received your injections on time, your chance of being pregnant is very low. If you think you may be pregnant, see your health care professional as soon as possible. Tell your health care professional if you want to get pregnant within the next year. The effect of this  medicine may last a long time after you get your last injection. What side effects may I notice from receiving this medicine? Side effects that you should report to your doctor or health care professional as soon as possible:  allergic reactions like skin rash, itching or hives, swelling of the face, lips, or tongue  breast tenderness or discharge  breathing problems  changes in vision  depression  feeling faint or lightheaded, falls  fever  pain in the abdomen, chest, groin, or leg  problems with balance, talking, walking  unusually weak or tired  yellowing of the eyes or skin Side effects that usually do not require medical attention (report to your doctor or health care professional if they continue or are bothersome):  acne  fluid retention and swelling  headache  irregular periods, spotting, or absent periods  temporary pain, itching, or skin reaction at site where injected  weight gain This list may not describe all possible side effects. Call your doctor for medical advice about side effects. You may report side effects to FDA at 1-800-FDA-1088. Where should I keep my medicine? This does not apply. The injection will be given to you by  a health care professional. NOTE: This sheet is a summary. It may not cover all possible information. If you have questions about this medicine, talk to your doctor, pharmacist, or health care provider.  2020 Elsevier/Gold Standard (2008-05-05 18:37:56) Levonorgestrel intrauterine device (IUD) What is this medicine? LEVONORGESTREL IUD (LEE voe nor jes trel) is a contraceptive (birth control) device. The device is placed inside the uterus by a healthcare professional. It is used to prevent pregnancy. This device can also be used to treat heavy bleeding that occurs during your period. This medicine may be used for other purposes; ask your health care provider or pharmacist if you have questions. COMMON BRAND NAME(S): Cameron Ali What should I tell my health care provider before I take this medicine? They need to know if you have any of these conditions:  abnormal Pap smear  cancer of the breast, uterus, or cervix  diabetes  endometritis  genital or pelvic infection now or in the past  have more than one sexual partner or your partner has more than one partner  heart disease  history of an ectopic or tubal pregnancy  immune system problems  IUD in place  liver disease or tumor  problems with blood clots or take blood-thinners  seizures  use intravenous drugs  uterus of unusual shape  vaginal bleeding that has not been explained  an unusual or allergic reaction to levonorgestrel, other hormones, silicone, or polyethylene, medicines, foods, dyes, or preservatives  pregnant or trying to get pregnant  breast-feeding How should I use this medicine? This device is placed inside the uterus by a health care professional. Talk to your pediatrician regarding the use of this medicine in children. Special care may be needed. Overdosage: If you think you have taken too much of this medicine contact a poison control center or emergency room at once. NOTE: This medicine is only for you. Do not share this medicine with others. What if I miss a dose? This does not apply. Depending on the brand of device you have inserted, the device will need to be replaced every 3 to 6 years if you wish to continue using this type of birth control. What may interact with this medicine? Do not take this medicine with any of the following medications:  amprenavir  bosentan  fosamprenavir This medicine may also interact with the following medications:  aprepitant  armodafinil  barbiturate medicines for inducing sleep or treating seizures  bexarotene  boceprevir  griseofulvin  medicines to treat seizures like carbamazepine, ethotoin, felbamate, oxcarbazepine, phenytoin,  topiramate  modafinil  pioglitazone  rifabutin  rifampin  rifapentine  some medicines to treat HIV infection like atazanavir, efavirenz, indinavir, lopinavir, nelfinavir, tipranavir, ritonavir  St. John's wort  warfarin This list may not describe all possible interactions. Give your health care provider a list of all the medicines, herbs, non-prescription drugs, or dietary supplements you use. Also tell them if you smoke, drink alcohol, or use illegal drugs. Some items may interact with your medicine. What should I watch for while using this medicine? Visit your doctor or health care professional for regular check ups. See your doctor if you or your partner has sexual contact with others, becomes HIV positive, or gets a sexual transmitted disease. This product does not protect you against HIV infection (AIDS) or other sexually transmitted diseases. You can check the placement of the IUD yourself by reaching up to the top of your vagina with clean fingers to feel the threads. Do not  pull on the threads. It is a good habit to check placement after each menstrual period. Call your doctor right away if you feel more of the IUD than just the threads or if you cannot feel the threads at all. The IUD may come out by itself. You may become pregnant if the device comes out. If you notice that the IUD has come out use a backup birth control method like condoms and call your health care provider. Using tampons will not change the position of the IUD and are okay to use during your period. This IUD can be safely scanned with magnetic resonance imaging (MRI) only under specific conditions. Before you have an MRI, tell your healthcare provider that you have an IUD in place, and which type of IUD you have in place. What side effects may I notice from receiving this medicine? Side effects that you should report to your doctor or health care professional as soon as possible:  allergic reactions like skin  rash, itching or hives, swelling of the face, lips, or tongue  fever, flu-like symptoms  genital sores  high blood pressure  no menstrual period for 6 weeks during use  pain, swelling, warmth in the leg  pelvic pain or tenderness  severe or sudden headache  signs of pregnancy  stomach cramping  sudden shortness of breath  trouble with balance, talking, or walking  unusual vaginal bleeding, discharge  yellowing of the eyes or skin Side effects that usually do not require medical attention (report to your doctor or health care professional if they continue or are bothersome):  acne  breast pain  change in sex drive or performance  changes in weight  cramping, dizziness, or faintness while the device is being inserted  headache  irregular menstrual bleeding within first 3 to 6 months of use  nausea This list may not describe all possible side effects. Call your doctor for medical advice about side effects. You may report side effects to FDA at 1-800-FDA-1088. Where should I keep my medicine? This does not apply. NOTE: This sheet is a summary. It may not cover all possible information. If you have questions about this medicine, talk to your doctor, pharmacist, or health care provider.  2020 Elsevier/Gold Standard (2018-02-23 13:22:01)

## 2019-10-27 NOTE — Telephone Encounter (Signed)
Pt has appt with Dr Edward Jolly today 10/27/19 at 10 am.  Encounter closed

## 2019-11-01 ENCOUNTER — Telehealth: Payer: Self-pay | Admitting: Obstetrics and Gynecology

## 2019-11-01 DIAGNOSIS — Z30430 Encounter for insertion of intrauterine contraceptive device: Secondary | ICD-10-CM

## 2019-11-01 NOTE — Telephone Encounter (Signed)
Message left to return call to Triage Nurse at [phone].    

## 2019-11-01 NOTE — Telephone Encounter (Signed)
Patient called to discuss her medication with Dr.Silva's nurse.

## 2019-11-02 NOTE — Telephone Encounter (Signed)
Patient is returning call.  °

## 2019-11-02 NOTE — Telephone Encounter (Signed)
AEX 02/2019 OV 7/1 with BS for dysmenorrhea  Spoke with pt. Pt states calling back after having discussion at last OV with Dr Edward Jolly about switching BCM. Pt states wanting to have Mirena IUD insertion. Pt's next LMP is 7/23 and will have heaviest days on 1-2. Pt aware to continue to take COCs til appt. Pt agreeable.   Pt scheduled for Mirena IUD insertion on 11/24/19 at 1030 am. Pt agreeable and verbalized understanding of date and time of appt. Pt aware of no earlier appts available at this time. Pt agreeable. Pt advised to take Motrin 800 mg with food and water one hour before procedure. Pt verbalized understanding.  PLAN: We discussed tx options including Mirena, Depo Provera, Dewayne Hatch, Depo Lupron.  Risks and benefits reviewed.  She is leaning toward Mirena IUD.  She will continue with her COCs for now  Routing to Dr Edward Jolly for review.  Encounter closed Cc: Hedda Slade for precert. Orders placed.

## 2019-11-02 NOTE — Telephone Encounter (Signed)
Left message for pt to return call to triage RN. 

## 2019-11-03 ENCOUNTER — Telehealth: Payer: Self-pay | Admitting: Obstetrics and Gynecology

## 2019-11-03 NOTE — Telephone Encounter (Signed)
Call placed to convey benefits for 11/24/19 scheduled appointment.

## 2019-11-17 NOTE — Telephone Encounter (Signed)
Call placed to convey benefits for 11/24/19 appointment.

## 2019-11-21 NOTE — Telephone Encounter (Signed)
Spoke with patient. Patient is scheduled for a mirena IUD insertion on 11/24/19.  Currently on Seasonal OCP, 3rd pills last row of 3rd pack, no menses to date. Patient reports menses like cramps like menses is going to start, but hasn't. No missed or late pills. Has not been SA in several months, used condoms at that time. She reports her menses has been irregular on OCP, asking if ok to proceed as scheduled?    Advised ok to proceed as scheduled, UPT will be done day of procedure. Continue to monitor menses. Advised I will update Dr. Edward Jolly and return call if any additional recommendations. Patient agreeable.   Routing to Dr. Edward Jolly for final review.

## 2019-11-21 NOTE — Telephone Encounter (Signed)
Spoke with patient regarding benefits for scheduled IUD insertion. Patient acknowledges understanding of information presented.  Patient stated that she was due to start her cycle on Friday, 11/18/2019, but has not started her cycle yet. Patient stated that she is experiencing cramping. Patient stated that she has not been sexually active for two months.

## 2019-11-22 ENCOUNTER — Telehealth: Payer: Self-pay

## 2019-11-22 NOTE — Telephone Encounter (Signed)
Patient left message regarding IUD appointment on (11/24/19). Patient states she has started her cycle and would like to  Know if she can still keep appointment.

## 2019-11-22 NOTE — Telephone Encounter (Signed)
Encounter closed

## 2019-11-22 NOTE — Telephone Encounter (Signed)
OK to proceed with Mirena IUD insertion as scheduled.

## 2019-11-22 NOTE — Telephone Encounter (Signed)
Left message for pt to return call to triage RN. 

## 2019-11-23 NOTE — Telephone Encounter (Signed)
Spoke with pt. Pt states started cycle on 7/27 and that was heaviest day. Pt states only having light spotting today. Pt is scheduled for IUD insertion on 11/24/19. Pt calling to make sure ok to proceed with IUD insertion. Pt advised ok to proceed with IUD insertion. Pt agreeable and verbalized understanding.  Encounter closed.

## 2019-11-24 ENCOUNTER — Telehealth: Payer: Self-pay | Admitting: Obstetrics and Gynecology

## 2019-11-24 ENCOUNTER — Ambulatory Visit (INDEPENDENT_AMBULATORY_CARE_PROVIDER_SITE_OTHER): Payer: BC Managed Care – PPO | Admitting: Obstetrics and Gynecology

## 2019-11-24 ENCOUNTER — Encounter: Payer: Self-pay | Admitting: Obstetrics and Gynecology

## 2019-11-24 ENCOUNTER — Other Ambulatory Visit: Payer: Self-pay

## 2019-11-24 VITALS — BP 108/60 | HR 76 | Resp 14 | Ht 59.0 in | Wt 203.0 lb

## 2019-11-24 DIAGNOSIS — Z30430 Encounter for insertion of intrauterine contraceptive device: Secondary | ICD-10-CM

## 2019-11-24 DIAGNOSIS — Z01812 Encounter for preprocedural laboratory examination: Secondary | ICD-10-CM

## 2019-11-24 LAB — POCT URINE PREGNANCY: Preg Test, Ur: NEGATIVE

## 2019-11-24 NOTE — Progress Notes (Signed)
GYNECOLOGY  VISIT   HPI: 24 y.o.   Married  Caucasian  female   G0P0000 with Patient's last menstrual period was 11/22/2019.   here for Mirena IUD insertion Patient states that she took 800mg  of Ibuprofen at 10:00am.  Patient needs contraception and she has chronic pelvic pain and dysmenorrhea.  Ultrasound done in January, 2021, and this was unremarkable. She had negative GC/CT/trich testing 12/10/18.  See Care Everywhere.  UPT: Negative     GYNECOLOGIC HISTORY: Patient's last menstrual period was 11/22/2019. Contraception:  Seasonale and condoms Menopausal hormone therapy:  n/a Last mammogram:  n/a Last pap smear:   06/2017 normal        OB History    Gravida  0   Para  0   Term  0   Preterm  0   AB  0   Living  0     SAB  0   TAB  0   Ectopic  0   Multiple  0   Live Births  0              There are no problems to display for this patient.   Past Medical History:  Diagnosis Date  . Amenorrhea   . Anxiety   . Asthma   . COVID-19 2021  . Dysmenorrhea   . Functional ovarian cysts   . Heart murmur    functional heart murmur--advised not to give blood    Past Surgical History:  Procedure Laterality Date  . APPENDECTOMY  2016  . TONSILLECTOMY AND ADENOIDECTOMY  2001    Current Outpatient Medications  Medication Sig Dispense Refill  . albuterol (VENTOLIN HFA) 108 (90 Base) MCG/ACT inhaler Inhale 1 puff into the lungs as needed.    2002 EPINEPHrine (EPIPEN 2-PAK) 0.3 mg/0.3 mL IJ SOAJ injection Inject 0.3 mg into the muscle as needed for anaphylaxis.    Marland Kitchen levonorgestrel-ethinyl estradiol (SEASONALE) 0.15-0.03 MG tablet Take 1 tablet by mouth daily. 1 Package 2  . sertraline (ZOLOFT) 100 MG tablet Take 2 tablets by mouth daily.     No current facility-administered medications for this visit.     ALLERGIES: Peanut oil, Penicillins, Latex, and Other  Family History  Problem Relation Age of Onset  . Thyroid disease Mother        hypothyroid  .  Diabetes Father   . Diabetes Maternal Grandmother   . Cancer Maternal Grandfather 74       colon ca  . Ovarian cancer Paternal Grandmother 33       dec ovarian ca  . Diabetes Paternal Grandfather   . Heart attack Paternal Grandfather   . Breast cancer Other     Social History   Socioeconomic History  . Marital status: Married    Spouse name: Not on file  . Number of children: Not on file  . Years of education: Not on file  . Highest education level: Not on file  Occupational History  . Not on file  Tobacco Use  . Smoking status: Never Smoker  . Smokeless tobacco: Never Used  Vaping Use  . Vaping Use: Never used  Substance and Sexual Activity  . Alcohol use: Never  . Drug use: Never  . Sexual activity: Yes    Birth control/protection: Condom, Pill    Comment: condoms everytime  Other Topics Concern  . Not on file  Social History Narrative  . Not on file   Social Determinants of Health   Financial Resource Strain:   .  Difficulty of Paying Living Expenses:   Food Insecurity:   . Worried About Programme researcher, broadcasting/film/video in the Last Year:   . Barista in the Last Year:   Transportation Needs:   . Freight forwarder (Medical):   Marland Kitchen Lack of Transportation (Non-Medical):   Physical Activity:   . Days of Exercise per Week:   . Minutes of Exercise per Session:   Stress:   . Feeling of Stress :   Social Connections:   . Frequency of Communication with Friends and Family:   . Frequency of Social Gatherings with Friends and Family:   . Attends Religious Services:   . Active Member of Clubs or Organizations:   . Attends Banker Meetings:   Marland Kitchen Marital Status:   Intimate Partner Violence:   . Fear of Current or Ex-Partner:   . Emotionally Abused:   Marland Kitchen Physically Abused:   . Sexually Abused:     Review of Systems  Constitutional: Negative.   HENT: Negative.   Eyes: Negative.   Respiratory: Negative.   Cardiovascular: Negative.   Gastrointestinal:  Negative.   Endocrine: Negative.   Genitourinary: Negative.   Musculoskeletal: Negative.   Skin: Negative.   Allergic/Immunologic: Negative.   Neurological: Negative.   Hematological: Negative.   Psychiatric/Behavioral: Negative.     PHYSICAL EXAMINATION:    BP (!) 108/60 (BP Location: Right Arm, Patient Position: Sitting, Cuff Size: Large)   Pulse 76   Resp 14   Ht 4\' 11"  (1.499 m)   Wt (!) 203 lb (92.1 kg)   LMP 11/22/2019   BMI 41.00 kg/m     General appearance: alert, cooperative and appears stated age  Pelvic: External genitalia:  no lesions              Urethra:  normal appearing urethra with no masses, tenderness or lesions              Bartholins and Skenes: normal                 Vagina: normal appearing vagina with normal color and discharge, no lesions              Cervix: no lesions                Bimanual Exam:  Uterus:  normal size, contour, position, consistency, mobility, non-tender              Adnexa: no mass, fullness, tenderness           Mirena IUD insertion - lot 11/24/2019, exp Oct 2023.  Consent for procedure. Hibiclens prep.  Local 1% lidocaine - lot [id number], exp 12 /1/21. Tenaculum to anterior cervical lip. Os finder used.  Uterus sounded to 7.2 cm. IUD placed without difficulty. No complications.  Minimal EBL. Repeat bimanual exam, no change.   Chaperone was present for exam.  04[birth year]01[birth year]2001.  ASSESSMENT  Mirena IUD placement.   PLAN  Instructions and precautions given. Back up protection for one week. IUD card to patient.  FU in 1 month for a check of IUD.

## 2019-11-24 NOTE — Telephone Encounter (Signed)
Patient was seen for an IUD insertion today and needs to return in 4 weeks for follow up. To triage to assist with scheduling with Dr.Silva.

## 2019-11-24 NOTE — Telephone Encounter (Signed)
Spoke with patient. OV scheduled for 12/21/19 at 4:30pm with Dr. Edward Jolly.  Patient verbalizes understanding and is agreeable to date and time.  Encounter closed.

## 2019-11-24 NOTE — Patient Instructions (Signed)
Intrauterine Device Insertion, Care After  This sheet gives you information about how to care for yourself after your procedure. Your health care provider may also give you more specific instructions. If you have problems or questions, contact your health care provider. What can I expect after the procedure? After the procedure, it is common to have:  Cramps and pain in the abdomen.  Light bleeding (spotting) or heavier bleeding that is like your menstrual period. This may last for up to a few days.  Lower back pain.  Dizziness.  Headaches.  Nausea. Follow these instructions at home:  Before resuming sexual activity, check to make sure that you can feel the IUD string(s). You should be able to feel the end of the string(s) below the opening of your cervix. If your IUD string is in place, you may resume sexual activity. ? If you had a hormonal IUD inserted more than 7 days after your most recent period started, you will need to use a backup method of birth control for 7 days after IUD insertion. Ask your health care provider whether this applies to you.  Continue to check that the IUD is still in place by feeling for the string(s) after every menstrual period, or once a month.  Take over-the-counter and prescription medicines only as told by your health care provider.  Do not drive or use heavy machinery while taking prescription pain medicine.  Keep all follow-up visits as told by your health care provider. This is important. Contact a health care provider if:  You have bleeding that is heavier or lasts longer than a normal menstrual cycle.  You have a fever.  You have cramps or abdominal pain that get worse or do not get better with medicine.  You develop abdominal pain that is new or is not in the same area of earlier cramping and pain.  You feel lightheaded or weak.  You have abnormal or bad-smelling discharge from your vagina.  You have pain during sexual  activity.  You have any of the following problems with your IUD string(s): ? The string bothers or hurts you or your sexual partner. ? You cannot feel the string. ? The string has gotten longer.  You can feel the IUD in your vagina.  You think you may be pregnant, or you miss your menstrual period.  You think you may have an STI (sexually transmitted infection). Get help right away if:  You have flu-like symptoms.  You have a fever and chills.  You can feel that your IUD has slipped out of place. Summary  After the procedure, it is common to have cramps and pain in the abdomen. It is also common to have light bleeding (spotting) or heavier bleeding that is like your menstrual period.  Continue to check that the IUD is still in place by feeling for the string(s) after every menstrual period, or once a month.  Keep all follow-up visits as told by your health care provider. This is important.  Contact your health care provider if you have problems with your IUD string(s), such as the string getting longer or bothering you or your sexual partner. This information is not intended to replace advice given to you by your health care provider. Make sure you discuss any questions you have with your health care provider. Document Revised: 03/27/2017 Document Reviewed: 03/05/2016 Elsevier Patient Education  2020 Elsevier Inc.  

## 2019-11-28 ENCOUNTER — Telehealth: Payer: Self-pay

## 2019-11-28 DIAGNOSIS — Z30431 Encounter for routine checking of intrauterine contraceptive device: Secondary | ICD-10-CM

## 2019-11-28 DIAGNOSIS — R102 Pelvic and perineal pain: Secondary | ICD-10-CM

## 2019-11-28 NOTE — Telephone Encounter (Signed)
Mirena IUD insertion 11/24/19 AEX 02/2019 1 month f/u scheduled for 12/21/19  Spoke with pt. Pt states having light pink spotting and abd cramping since IUD insertion last Thursday. Pt states is taking 800 mg of Ibuprofen and using heating pad. Pt states abd cramps come back around time needed for Ibuprofen between the 6-8 hrs. Pt denies heavy bleeding, clots. Pt states Ibuprofen resolves abd cramps until time to take more. Pt denies wearing any pad or panty liner. Pt asking how long will she have cramps and need to take medications?   Advised pt that light pink spotting is normal after IUD insertion. Pt advised to continue to take Ibuprofen as directed and use heating pad as needed as well. Pt to add 500 mg of Tylenol every 4 hours as needed. Pt agreeable. Pt advised that I will review with Dr Edward Jolly and return call with any additional recommendations/advice. Pt agreeable.   Routing to Dr Edward Jolly

## 2019-11-28 NOTE — Telephone Encounter (Signed)
Patient is calling in regards to cramping and bleeding after having IUD placed.

## 2019-11-29 NOTE — Telephone Encounter (Signed)
Left message for pt to return call to triage RN. 

## 2019-11-29 NOTE — Telephone Encounter (Signed)
Spoke with pt. Pt given update and recommendations. Pt agreeable to come for PUS for IUD recheck.   Pt scheduled for PUS on 8/5 at 430 pm. Pt agreeable and verbalized understanding of date and time of appt.  Orders placed. Routing to Dr Edward Jolly for review.  Cc: Hayley for precert.  Encounter closed.

## 2019-11-29 NOTE — Telephone Encounter (Signed)
Please have her come in this week for a pelvic ultrasound and recheck appointment.  This will need a precert.

## 2019-12-01 ENCOUNTER — Ambulatory Visit: Payer: BC Managed Care – PPO | Admitting: Obstetrics and Gynecology

## 2019-12-01 ENCOUNTER — Other Ambulatory Visit (HOSPITAL_COMMUNITY)
Admission: RE | Admit: 2019-12-01 | Discharge: 2019-12-01 | Disposition: A | Discharge status: Home / Self Care | Payer: Self-pay | Source: Ambulatory Visit | Attending: Obstetrics and Gynecology | Admitting: Obstetrics and Gynecology

## 2019-12-01 ENCOUNTER — Ambulatory Visit (INDEPENDENT_AMBULATORY_CARE_PROVIDER_SITE_OTHER): Payer: BC Managed Care – PPO

## 2019-12-01 ENCOUNTER — Encounter: Payer: Self-pay | Admitting: Obstetrics and Gynecology

## 2019-12-01 ENCOUNTER — Other Ambulatory Visit: Payer: Self-pay

## 2019-12-01 VITALS — BP 122/68 | HR 64 | Resp 14 | Ht 59.0 in | Wt 203.0 lb

## 2019-12-01 DIAGNOSIS — R102 Pelvic and perineal pain: Secondary | ICD-10-CM

## 2019-12-01 DIAGNOSIS — Z30431 Encounter for routine checking of intrauterine contraceptive device: Secondary | ICD-10-CM

## 2019-12-01 DIAGNOSIS — Z113 Encounter for screening for infections with a predominantly sexual mode of transmission: Secondary | ICD-10-CM | POA: Diagnosis not present

## 2019-12-01 NOTE — Progress Notes (Signed)
GYNECOLOGY  VISIT   HPI: 24 y.o.   Married  Caucasian  female   G0P0000 with Patient's last menstrual period was 11/22/2019.   here for ultrasound and IUD check     She is having shooting pain into the vagina.  She feels like she is about to start her period.  Taking Ibuprofen every 6 - 8 hours.  No bleeding for 2 days.   She is having daily headaches.  She has light sensitivity.  Ibuprofen is not treating this.   She does state a history of menstrual headaches in high school.  No fevers.   She stopped her Seasonale 11/25/19.  GYNECOLOGIC HISTORY: Patient's last menstrual period was 11/22/2019. Contraception:  Mirena IUD Menopausal hormone therapy:  n/a Last mammogram:  n/a Last pap smear:   06/2017 Normal        OB History    Gravida  0   Para  0   Term  0   Preterm  0   AB  0   Living  0     SAB  0   TAB  0   Ectopic  0   Multiple  0   Live Births  0              There are no problems to display for this patient.   Past Medical History:  Diagnosis Date  . Amenorrhea   . Anxiety   . Asthma   . COVID-19 2021  . Dysmenorrhea   . Functional ovarian cysts   . Heart murmur    functional heart murmur--advised not to give blood    Past Surgical History:  Procedure Laterality Date  . APPENDECTOMY  2016  . TONSILLECTOMY AND ADENOIDECTOMY  2001    Current Outpatient Medications  Medication Sig Dispense Refill  . albuterol (VENTOLIN HFA) 108 (90 Base) MCG/ACT inhaler Inhale 1 puff into the lungs as needed.    Marland Kitchen EPINEPHrine (EPIPEN 2-PAK) 0.3 mg/0.3 mL IJ SOAJ injection Inject 0.3 mg into the muscle as needed for anaphylaxis.    Marland Kitchen levonorgestrel (MIRENA, 52 MG,) 20 MCG/24HR IUD 1 each by Intrauterine route once.    . sertraline (ZOLOFT) 100 MG tablet Take 2 tablets by mouth daily.     No current facility-administered medications for this visit.     ALLERGIES: Peanut oil, Penicillins, Latex, and Other  Family History  Problem Relation Age  of Onset  . Thyroid disease Mother        hypothyroid  . Diabetes Father   . Diabetes Maternal Grandmother   . Cancer Maternal Grandfather 17       colon ca  . Ovarian cancer Paternal Grandmother 46       dec ovarian ca  . Diabetes Paternal Grandfather   . Heart attack Paternal Grandfather   . Breast cancer Other     Social History   Socioeconomic History  . Marital status: Married    Spouse name: Not on file  . Number of children: Not on file  . Years of education: Not on file  . Highest education level: Not on file  Occupational History  . Not on file  Tobacco Use  . Smoking status: Never Smoker  . Smokeless tobacco: Never Used  Vaping Use  . Vaping Use: Never used  Substance and Sexual Activity  . Alcohol use: Never  . Drug use: Never  . Sexual activity: Yes    Birth control/protection: Condom, I.U.D.    Comment: condoms everytime  Other Topics Concern  . Not on file  Social History Narrative  . Not on file   Social Determinants of Health   Financial Resource Strain:   . Difficulty of Paying Living Expenses:   Food Insecurity:   . Worried About Programme researcher, broadcasting/film/video in the Last Year:   . Barista in the Last Year:   Transportation Needs:   . Freight forwarder (Medical):   Marland Kitchen Lack of Transportation (Non-Medical):   Physical Activity:   . Days of Exercise per Week:   . Minutes of Exercise per Session:   Stress:   . Feeling of Stress :   Social Connections:   . Frequency of Communication with Friends and Family:   . Frequency of Social Gatherings with Friends and Family:   . Attends Religious Services:   . Active Member of Clubs or Organizations:   . Attends Banker Meetings:   Marland Kitchen Marital Status:   Intimate Partner Violence:   . Fear of Current or Ex-Partner:   . Emotionally Abused:   Marland Kitchen Physically Abused:   . Sexually Abused:     Review of Systems  Genitourinary: Positive for vaginal pain.       Cramping   All other systems  reviewed and are negative.   PHYSICAL EXAMINATION:    BP 122/68 (BP Location: Right Arm, Patient Position: Sitting, Cuff Size: Normal)   Pulse 64   Resp 14   Ht 4\' 11"  (1.499 m)   Wt 203 lb (92.1 kg)   LMP 11/22/2019   BMI 41.00 kg/m     General appearance: alert, cooperative and appears stated age  Pelvic: External genitalia:  no lesions              Urethra:  normal appearing urethra with no masses, tenderness or lesions              Bartholins and Skenes: normal                 Vagina: normal appearing vagina with normal color and discharge, no lesions              Cervix: no lesions                Bimanual Exam:  Uterus:  normal size, contour, position, consistency, mobility, non-tender              Adnexa: no mass, fullness, tenderness              Pelvic 11/24/2019 Uterus no masses. EMS 4.43 mm. Ovaries normal.  No adnexal masses.  No free fluid.  Chaperone was present for exam.  ASSESSMENT  IUD check up.  Pelvic pain.  Headache.  Recent withdrawal from COCs.  PLAN  Reassurance regarding pelvic US findings and IUD position.  GC/CT/trich testing.  She will see her PCP if her HA continues.  Possible need for estrogen supplementation during menses? Keep appointment for one month IUD check.

## 2019-12-01 NOTE — Progress Notes (Signed)
Encounter reviewed by Dr. Rhyan Wolters Amundson C. Silva.  

## 2019-12-04 LAB — CERVICOVAGINAL ANCILLARY ONLY
Chlamydia: NEGATIVE
Comment: NEGATIVE
Comment: NEGATIVE
Comment: NORMAL
Neisseria Gonorrhea: NEGATIVE
Trichomonas: NEGATIVE

## 2019-12-06 ENCOUNTER — Telehealth: Payer: Self-pay

## 2019-12-06 NOTE — Telephone Encounter (Signed)
Patient left message regarding results.

## 2019-12-06 NOTE — Telephone Encounter (Signed)
Spoke with pt. Pt states already given results today by Ander Slade, CNM and has no further questions.  Encounter closed.   Eliezer Bottom, CMA  12/06/2019 1:13 PM EDT Back to Top    Patient notified of normal results as written by provider   Patton Salles, MD  12/05/2019 9:38 AM EDT     Please inform patient of negative GC/CT/trich testing.   I am highlighting this result so you know to contact the patient.

## 2019-12-12 ENCOUNTER — Encounter: Payer: Self-pay | Admitting: Obstetrics and Gynecology

## 2019-12-12 ENCOUNTER — Telehealth: Payer: Self-pay | Admitting: Obstetrics and Gynecology

## 2019-12-12 ENCOUNTER — Other Ambulatory Visit: Payer: Self-pay

## 2019-12-12 ENCOUNTER — Ambulatory Visit (INDEPENDENT_AMBULATORY_CARE_PROVIDER_SITE_OTHER): Payer: BC Managed Care – PPO | Admitting: Obstetrics and Gynecology

## 2019-12-12 VITALS — BP 138/84 | HR 76 | Ht 59.0 in | Wt 208.0 lb

## 2019-12-12 DIAGNOSIS — Z30432 Encounter for removal of intrauterine contraceptive device: Secondary | ICD-10-CM

## 2019-12-12 DIAGNOSIS — Z3009 Encounter for other general counseling and advice on contraception: Secondary | ICD-10-CM

## 2019-12-12 MED ORDER — MEDROXYPROGESTERONE ACETATE 150 MG/ML IM SUSP
150.0000 mg | Freq: Once | INTRAMUSCULAR | Status: AC
Start: 2019-12-12 — End: 2019-12-12
  Administered 2019-12-12: 150 mg via INTRAMUSCULAR

## 2019-12-12 NOTE — Patient Instructions (Signed)
Medroxyprogesterone injection [Contraceptive] What is this medicine? MEDROXYPROGESTERONE (me DROX ee proe JES te rone) contraceptive injections prevent pregnancy. They provide effective birth control for 3 months. Depo-subQ Provera 104 is also used for treating pain related to endometriosis. This medicine may be used for other purposes; ask your health care provider or pharmacist if you have questions. COMMON BRAND NAME(S): Depo-Provera, Depo-subQ Provera 104 What should I tell my health care provider before I take this medicine? They need to know if you have any of these conditions:  frequently drink alcohol  asthma  blood vessel disease or a history of a blood clot in the lungs or legs  bone disease such as osteoporosis  breast cancer  diabetes  eating disorder (anorexia nervosa or bulimia)  high blood pressure  HIV infection or AIDS  kidney disease  liver disease  mental depression  migraine  seizures (convulsions)  stroke  tobacco smoker  vaginal bleeding  an unusual or allergic reaction to medroxyprogesterone, other hormones, medicines, foods, dyes, or preservatives  pregnant or trying to get pregnant  breast-feeding How should I use this medicine? Depo-Provera Contraceptive injection is given into a muscle. Depo-subQ Provera 104 injection is given under the skin. These injections are given by a health care professional. You must not be pregnant before getting an injection. The injection is usually given during the first 5 days after the start of a menstrual period or 6 weeks after delivery of a baby. Talk to your pediatrician regarding the use of this medicine in children. Special care may be needed. These injections have been used in female children who have started having menstrual periods. Overdosage: If you think you have taken too much of this medicine contact a poison control center or emergency room at once. NOTE: This medicine is only for you. Do not  share this medicine with others. What if I miss a dose? Try not to miss a dose. You must get an injection once every 3 months to maintain birth control. If you cannot keep an appointment, call and reschedule it. If you wait longer than 13 weeks between Depo-Provera contraceptive injections or longer than 14 weeks between Depo-subQ Provera 104 injections, you could get pregnant. Use another method for birth control if you miss your appointment. You may also need a pregnancy test before receiving another injection. What may interact with this medicine? Do not take this medicine with any of the following medications:  bosentan This medicine may also interact with the following medications:  aminoglutethimide  antibiotics or medicines for infections, especially rifampin, rifabutin, rifapentine, and griseofulvin  aprepitant  barbiturate medicines such as phenobarbital or primidone  bexarotene  carbamazepine  medicines for seizures like ethotoin, felbamate, oxcarbazepine, phenytoin, topiramate  modafinil  St. John's wort This list may not describe all possible interactions. Give your health care provider a list of all the medicines, herbs, non-prescription drugs, or dietary supplements you use. Also tell them if you smoke, drink alcohol, or use illegal drugs. Some items may interact with your medicine. What should I watch for while using this medicine? This drug does not protect you against HIV infection (AIDS) or other sexually transmitted diseases. Use of this product may cause you to lose calcium from your bones. Loss of calcium may cause weak bones (osteoporosis). Only use this product for more than 2 years if other forms of birth control are not right for you. The longer you use this product for birth control the more likely you will be at risk   for weak bones. Ask your health care professional how you can keep strong bones. You may have a change in bleeding pattern or irregular periods.  Many females stop having periods while taking this drug. If you have received your injections on time, your chance of being pregnant is very low. If you think you may be pregnant, see your health care professional as soon as possible. Tell your health care professional if you want to get pregnant within the next year. The effect of this medicine may last a long time after you get your last injection. What side effects may I notice from receiving this medicine? Side effects that you should report to your doctor or health care professional as soon as possible:  allergic reactions like skin rash, itching or hives, swelling of the face, lips, or tongue  breast tenderness or discharge  breathing problems  changes in vision  depression  feeling faint or lightheaded, falls  fever  pain in the abdomen, chest, groin, or leg  problems with balance, talking, walking  unusually weak or tired  yellowing of the eyes or skin Side effects that usually do not require medical attention (report to your doctor or health care professional if they continue or are bothersome):  acne  fluid retention and swelling  headache  irregular periods, spotting, or absent periods  temporary pain, itching, or skin reaction at site where injected  weight gain This list may not describe all possible side effects. Call your doctor for medical advice about side effects. You may report side effects to FDA at 1-800-FDA-1088. Where should I keep my medicine? This does not apply. The injection will be given to you by a health care professional. NOTE: This sheet is a summary. It may not cover all possible information. If you have questions about this medicine, talk to your doctor, pharmacist, or health care provider.  2020 Elsevier/Gold Standard (2008-05-05 18:37:56)  

## 2019-12-12 NOTE — Telephone Encounter (Signed)
Patient is still having pain with iud.

## 2019-12-12 NOTE — Addendum Note (Signed)
Addended by: Isabell Jarvis on: 12/12/2019 11:54 AM   Modules accepted: Orders

## 2019-12-12 NOTE — Telephone Encounter (Signed)
Spoke with pt. Pt states having increased worsening pain with IUD today and yesterday. Pt had Mirena  IUD insertion on 11/24/19.  Pt states pain on scale from 7-9 and has missed work today due to pain. Pt requesting to come today for OV for IUD removal. Pt advised will review with Dr Edward Jolly and return call. Pt agreeable.  After reviewing call with Dr Edward Jolly. Ok to have work-in appt today. Pt scheduled with Dr Edward Jolly at 115pm. Pt agreeable to date and time of appt.  EK:BTCYEL for precert Encounter closed.

## 2019-12-12 NOTE — Progress Notes (Signed)
GYNECOLOGY  VISIT   HPI: 24 y.o.   Married  Caucasian  female   G0P0000 with Patient's last menstrual period was 11/22/2019.   here for Mirena IUD removal.    Really bad cramps and started bleeding today.  Just wants the IUD out.   She was on Seasonale prior to receiving the Mirena.  Patient has painful menses and daily cramping.  Unable to have intercourse.   Neg GC/CT/trich on 12/01/19.  States she has more anxiety than depression.  She has social anxiety.   GYNECOLOGIC HISTORY: Patient's last menstrual period was 11/22/2019. Contraception: Mirena IUD 11-24-19 Menopausal hormone therapy:  n/a Last mammogram:  n/a Last pap smear: 06/2017 normal        OB History    Gravida  0   Para  0   Term  0   Preterm  0   AB  0   Living  0     SAB  0   TAB  0   Ectopic  0   Multiple  0   Live Births  0              There are no problems to display for this patient.   Past Medical History:  Diagnosis Date  . Amenorrhea   . Anxiety   . Asthma   . COVID-19 2021  . Dysmenorrhea   . Functional ovarian cysts   . Heart murmur    functional heart murmur--advised not to give blood    Past Surgical History:  Procedure Laterality Date  . APPENDECTOMY  2016  . TONSILLECTOMY AND ADENOIDECTOMY  2001    Current Outpatient Medications  Medication Sig Dispense Refill  . albuterol (VENTOLIN HFA) 108 (90 Base) MCG/ACT inhaler Inhale 1 puff into the lungs as needed.    Marland Kitchen EPINEPHrine (EPIPEN 2-PAK) 0.3 mg/0.3 mL IJ SOAJ injection Inject 0.3 mg into the muscle as needed for anaphylaxis.    Marland Kitchen levonorgestrel (MIRENA, 52 MG,) 20 MCG/24HR IUD 1 each by Intrauterine route once.    . sertraline (ZOLOFT) 100 MG tablet Take 2 tablets by mouth daily.     No current facility-administered medications for this visit.     ALLERGIES: Peanut oil, Penicillins, Latex, and Other  Family History  Problem Relation Age of Onset  . Thyroid disease Mother        hypothyroid  .  Diabetes Father   . Diabetes Maternal Grandmother   . Cancer Maternal Grandfather 108       colon ca  . Ovarian cancer Paternal Grandmother 46       dec ovarian ca  . Diabetes Paternal Grandfather   . Heart attack Paternal Grandfather   . Breast cancer Other     Social History   Socioeconomic History  . Marital status: Married    Spouse name: Not on file  . Number of children: Not on file  . Years of education: Not on file  . Highest education level: Not on file  Occupational History  . Not on file  Tobacco Use  . Smoking status: Never Smoker  . Smokeless tobacco: Never Used  Vaping Use  . Vaping Use: Never used  Substance and Sexual Activity  . Alcohol use: Never  . Drug use: Never  . Sexual activity: Yes    Birth control/protection: Condom, I.U.D.    Comment: condoms everytime  Other Topics Concern  . Not on file  Social History Narrative  . Not on file   Social  Determinants of Health   Financial Resource Strain:   . Difficulty of Paying Living Expenses:   Food Insecurity:   . Worried About Programme researcher, broadcasting/film/video in the Last Year:   . Barista in the Last Year:   Transportation Needs:   . Freight forwarder (Medical):   Marland Kitchen Lack of Transportation (Non-Medical):   Physical Activity:   . Days of Exercise per Week:   . Minutes of Exercise per Session:   Stress:   . Feeling of Stress :   Social Connections:   . Frequency of Communication with Friends and Family:   . Frequency of Social Gatherings with Friends and Family:   . Attends Religious Services:   . Active Member of Clubs or Organizations:   . Attends Banker Meetings:   Marland Kitchen Marital Status:   Intimate Partner Violence:   . Fear of Current or Ex-Partner:   . Emotionally Abused:   Marland Kitchen Physically Abused:   . Sexually Abused:     Review of Systems  All other systems reviewed and are negative.   PHYSICAL EXAMINATION:    BP 138/84 (Cuff Size: Large)   Pulse 76   Ht 4\' 11"  (1.499  m)   Wt 208 lb (94.3 kg)   LMP 11/22/2019   BMI 42.01 kg/m     General appearance: alert, cooperative and appears stated age   Pelvic: External genitalia:  no lesions              Urethra:  normal appearing urethra with no masses, tenderness or lesions              Bartholins and Skenes: normal                 Vagina: normal appearing vagina with normal color and discharge, no lesions              Cervix: no lesions.  IUD string noted.   Verbal consent for removal.  Ring forceps used and IUD removed intact and discarded.  She declined to see it.                 Bimanual Exam:  Uterus:  normal size, contour, position, consistency, mobility, non-tender              Adnexa: no mass, fullness, tenderness        Chaperone was present for exam.  ASSESSMENT  Pelvic pain.  Pain with Mirena IUD.  Anxiety.   PLAN  She wants to start Depo Provera today.  Ok for Depo Provera 150 mg IM x 1.  We reviewed potential side effects.  Return for annual exam in 12 weeks.

## 2019-12-12 NOTE — Telephone Encounter (Signed)
Orders placed.  Encounter closed. 

## 2019-12-21 ENCOUNTER — Ambulatory Visit: Payer: Self-pay | Admitting: Obstetrics and Gynecology

## 2020-01-16 ENCOUNTER — Telehealth: Payer: Self-pay | Admitting: Obstetrics and Gynecology

## 2020-01-16 NOTE — Telephone Encounter (Signed)
Left message on voicemail to call and reschedule cancelled appointment. °

## 2020-02-06 ENCOUNTER — Telehealth: Payer: Self-pay

## 2020-02-06 NOTE — Telephone Encounter (Signed)
Patient is having an endometriosis flare up.

## 2020-02-06 NOTE — Telephone Encounter (Signed)
Spoke with patient. Patient reports pain in lower left side of pelvis 8/10 when sitting. Pain is getting progressively worse. started on 02/01/20. Not relieved by OTC tylenol or ibuprofen. Denies N/V, fever/chills, heavy vaginal bleeding, urinary symptoms, vag d/c or odor. Depo Provera for contraceptive, no menses with depo. Patient reports Hx of endometriosis, depo provera was helping initially. PUS completed on 12/01/19. Patient is requesting additional treatment options.   Advised OV needed for further evaluation, patient declined due to no insurance coverage at this time. ER/Urgent care precautions given for new or worsening symptoms. Patient states she will return call at a later date to schedule f/u from 12/12/19 OV. Advised patient I will update Dr. Edward Jolly and return call if any additional recommendations, patient agreeable.   Routing to Dr. Edward Jolly for final review.

## 2020-02-07 MED ORDER — IBUPROFEN 800 MG PO TABS: 800.0000 mg | ORAL_TABLET | Freq: Three times a day (TID) | ORAL | 1 refills | Status: DC | PRN

## 2020-02-07 NOTE — Telephone Encounter (Signed)
I agree with an office visit if we are going to initiate another treatment option.  I am happy to prescribe Motrin 800 mg po q 8 hours if she is able to take this medication.  #100, RF:  one.

## 2020-02-07 NOTE — Telephone Encounter (Signed)
Spoke with patient, advised per Dr. Edward Jolly. Patient declines OV at this time, request Rx to pharmacy on file. Patient states she will return call to office at later date to schedule OV. Patient notified of drug interaction as seen below, educated on s/s of GI bleeding. Questions answered. Patient verbalizes understanding and is agreeable.   Drug-Drug: ibuprofen and sertraline Toxic effects may be increased with concurrent administration of ibuprofen and Selective Serotonin Reuptake Inhibitors. The risk of upper gastrointestinal bleeding may be increased. Patients taking both drugs concurrently should be educated about the signs and symptoms of GI bleeding.  Routing to provider for final review. Patient is agreeable to disposition. Will close encounter.

## 2020-02-29 ENCOUNTER — Ambulatory Visit: Payer: BC Managed Care – PPO

## 2020-03-12 ENCOUNTER — Ambulatory Visit: Payer: BC Managed Care – PPO | Admitting: Obstetrics and Gynecology

## 2020-05-20 ENCOUNTER — Emergency Department (HOSPITAL_COMMUNITY): Payer: Self-pay

## 2020-05-20 ENCOUNTER — Encounter (HOSPITAL_COMMUNITY): Payer: Self-pay | Admitting: Emergency Medicine

## 2020-05-20 ENCOUNTER — Other Ambulatory Visit: Payer: Self-pay

## 2020-05-20 ENCOUNTER — Emergency Department (HOSPITAL_COMMUNITY)
Admission: EM | Admit: 2020-05-20 | Discharge: 2020-05-20 | Disposition: A | Discharge status: Home / Self Care | Payer: Self-pay | Attending: Emergency Medicine | Admitting: Emergency Medicine

## 2020-05-20 DIAGNOSIS — R1032 Left lower quadrant pain: Secondary | ICD-10-CM | POA: Insufficient documentation

## 2020-05-20 DIAGNOSIS — N912 Amenorrhea, unspecified: Secondary | ICD-10-CM | POA: Insufficient documentation

## 2020-05-20 DIAGNOSIS — Z79899 Other long term (current) drug therapy: Secondary | ICD-10-CM | POA: Insufficient documentation

## 2020-05-20 DIAGNOSIS — J45909 Unspecified asthma, uncomplicated: Secondary | ICD-10-CM | POA: Insufficient documentation

## 2020-05-20 DIAGNOSIS — Z8616 Personal history of COVID-19: Secondary | ICD-10-CM | POA: Insufficient documentation

## 2020-05-20 LAB — CBC WITH DIFFERENTIAL/PLATELET
Abs Immature Granulocytes: 0.03 10*3/uL (ref 0.00–0.07)
Basophils Absolute: 0 10*3/uL (ref 0.0–0.1)
Basophils Relative: 0 %
Eosinophils Absolute: 0.1 10*3/uL (ref 0.0–0.5)
Eosinophils Relative: 2 %
HCT: 43.5 % (ref 36.0–46.0)
Hemoglobin: 14.8 g/dL (ref 12.0–15.0)
Immature Granulocytes: 0 %
Lymphocytes Relative: 27 %
Lymphs Abs: 2 10*3/uL (ref 0.7–4.0)
MCH: 30 pg (ref 26.0–34.0)
MCHC: 34 g/dL (ref 30.0–36.0)
MCV: 88.2 fL (ref 80.0–100.0)
Monocytes Absolute: 0.4 10*3/uL (ref 0.1–1.0)
Monocytes Relative: 5 %
Neutro Abs: 4.7 10*3/uL (ref 1.7–7.7)
Neutrophils Relative %: 66 %
Platelets: 296 10*3/uL (ref 150–400)
RBC: 4.93 MIL/uL (ref 3.87–5.11)
RDW: 12.3 % (ref 11.5–15.5)
WBC: 7.2 10*3/uL (ref 4.0–10.5)
nRBC: 0 % (ref 0.0–0.2)

## 2020-05-20 LAB — COMPREHENSIVE METABOLIC PANEL
ALT: 23 U/L (ref 0–44)
AST: 18 U/L (ref 15–41)
Albumin: 4.2 g/dL (ref 3.5–5.0)
Alkaline Phosphatase: 60 U/L (ref 38–126)
Anion gap: 7 (ref 5–15)
BUN: 16 mg/dL (ref 6–20)
CO2: 25 mmol/L (ref 22–32)
Calcium: 9.1 mg/dL (ref 8.9–10.3)
Chloride: 108 mmol/L (ref 98–111)
Creatinine, Ser: 0.64 mg/dL (ref 0.44–1.00)
GFR, Estimated: >60 mL/min (ref >60)
Glucose, Bld: 100 mg/dL — ABNORMAL HIGH (ref 70–99)
Potassium: 3.4 mmol/L — ABNORMAL LOW (ref 3.5–5.1)
Sodium: 140 mmol/L (ref 135–145)
Total Bilirubin: 0.6 mg/dL (ref 0.3–1.2)
Total Protein: 7.1 g/dL (ref 6.5–8.1)

## 2020-05-20 LAB — URINALYSIS, ROUTINE W REFLEX MICROSCOPIC
Bilirubin Urine: NEGATIVE
Glucose, UA: NEGATIVE mg/dL
Hgb urine dipstick: NEGATIVE
Ketones, ur: NEGATIVE mg/dL
Leukocytes,Ua: NEGATIVE
Nitrite: NEGATIVE
Protein, ur: NEGATIVE mg/dL
Specific Gravity, Urine: 1.02 (ref 1.005–1.030)
pH: 5 (ref 5.0–8.0)

## 2020-05-20 LAB — PREGNANCY, URINE: Preg Test, Ur: NEGATIVE

## 2020-05-20 MED ORDER — IOHEXOL 300 MG/ML  SOLN
100.0000 mL | Freq: Once | INTRAMUSCULAR | Status: AC | PRN
  Administered 2020-05-20: 100 mL via INTRAVENOUS

## 2020-05-20 MED ORDER — DICLOFENAC SODIUM 75 MG PO TBEC: 75.0000 mg | DELAYED_RELEASE_TABLET | Freq: Two times a day (BID) | ORAL | 0 refills | Status: DC

## 2020-05-20 NOTE — ED Triage Notes (Signed)
History of endometriosis.  C/o pain to left side.  Pt says, "its always to her left side."  Rates pain 7/10, cramping, pressure, and bloating.  Have not taken any medications this am.

## 2020-05-20 NOTE — ED Provider Notes (Signed)
[facility] EMERGENCY DEPARTMENT Provider Note   CSN: [id number] Arrival date & time: 05/20/20  3419     History Chief Complaint  Patient presents with  . Abdominal Pain    Tamara Carroll is a 25 y.o. female.  HPI      Tamara Carroll is a 25 y.o. female with past medical history significant for endometriosis and amenorrhea who presents to the Emergency Department complaining of left lower abdominal pain pain has been present for several days to weeks, wakes her from sleep at times.  She describes the pain as constant with cramping and pressure pain is nonradiating.  She states that she was diagnosed with endometriosis 1 year ago she was started on oral birth control at that time.  She was since weaned from oral birth control and started on Depo-Medrol injections last March.  She states she had one Depo injection but was unable to have any subsequent injections due to lack of insurance.  She states she has not had a menstrual cycle since the Depo injection and has always had cramping pain on her left side. She has been prescribed ibuprofen which helps.  She has been unable to follow-up with her gynecologist. She denies fever, vomiting, diarrhea, dysuria or flank pain.  No vaginal discharge, spotting or bleeding.    Past Medical History:  Diagnosis Date  . Amenorrhea   . Anxiety   . Asthma   . COVID-19 2021  . Dysmenorrhea   . Functional ovarian cysts   . Heart murmur    functional heart murmur--advised not to give blood    There are no problems to display for this patient.   Past Surgical History:  Procedure Laterality Date  . APPENDECTOMY  2016  . TONSILLECTOMY AND ADENOIDECTOMY  2001     OB History    Gravida  0   Para  0   Term  0   Preterm  0   AB  0   Living  0     SAB  0   IAB  0   Ectopic  0   Multiple  0   Live Births  0           Family History  Problem Relation Age of Onset  . Thyroid disease Mother        hypothyroid  .  Diabetes Father   . Diabetes Maternal Grandmother   . Cancer Maternal Grandfather 40       colon ca  . Ovarian cancer Paternal Grandmother 52       dec ovarian ca  . Diabetes Paternal Grandfather   . Heart attack Paternal Grandfather   . Breast cancer Other     Social History   Tobacco Use  . Smoking status: Never Smoker  . Smokeless tobacco: Never Used  Vaping Use  . Vaping Use: Never used  Substance Use Topics  . Alcohol use: Never  . Drug use: Never    Home Medications Prior to Admission medications   Medication Sig Start Date End Date Taking? Authorizing Provider  albuterol (VENTOLIN HFA) 108 (90 Base) MCG/ACT inhaler Inhale 1 puff into the lungs as needed. 10/01/15   [provider]  EPINEPHrine (EPIPEN 2-PAK) 0.3 mg/0.3 mL IJ SOAJ injection Inject 0.3 mg into the muscle as needed for anaphylaxis.    [provider]  ibuprofen (ADVIL) 800 MG tablet Take 1 tablet (800 mg total) by mouth every 8 (eight) hours as needed for moderate pain. 02/07/20  Patton Salles, MD  levonorgestrel (MIRENA, 52 MG,) 20 MCG/24HR IUD 1 each by Intrauterine route once.    [provider]  sertraline (ZOLOFT) 100 MG tablet Take 2 tablets by mouth daily. 05/19/18   [provider]    Allergies    Peanut oil, Penicillins, Latex, and Other  Review of Systems   Review of Systems  Constitutional: Negative for appetite change, chills and fever.  Respiratory: Negative for shortness of breath.   Cardiovascular: Negative for chest pain.  Gastrointestinal: Positive for abdominal pain. Negative for blood in stool, diarrhea, nausea and vomiting.  Genitourinary: Positive for menstrual problem. Negative for decreased urine volume, difficulty urinating, dysuria, flank pain, vaginal bleeding and vaginal discharge.  Musculoskeletal: Negative for back pain.  Skin: Negative for color change and rash.  Neurological: Negative for dizziness, weakness and numbness.   Hematological: Negative for adenopathy.    Physical Exam Updated Vital Signs BP 124/75 (BP Location: Right Arm)   Pulse 77   Temp 99 F (37.2 C) (Oral)   Resp 16   Ht 4\' 11"  (1.499 m)   Wt 90.7 kg   SpO2 100%   BMI 40.40 kg/m   Physical Exam Vitals and nursing note reviewed.  Constitutional:      Appearance: Normal appearance. She is well-developed. She is not ill-appearing or toxic-appearing.  Neck:     Thyroid: No thyromegaly.     Meningeal: Kernig's sign absent.  Cardiovascular:     Rate and Rhythm: Normal rate and regular rhythm.     Pulses: Normal pulses.  Pulmonary:     Effort: Pulmonary effort is normal. No respiratory distress.     Breath sounds: Normal breath sounds. No wheezing.  Abdominal:     General: There is no distension.     Palpations: Abdomen is soft.     Tenderness: There is abdominal tenderness. There is no right CVA tenderness, left CVA tenderness, guarding or rebound.     Comments: tenderness to palpation of the left lower abdomen.  No guarding or rebound.  Abdomen is soft no distention.  Musculoskeletal:        General: Normal range of motion.     Cervical back: Normal range of motion and neck supple.     Right lower leg: No edema.     Left lower leg: No edema.  Skin:    General: Skin is warm.     Findings: No rash.  Neurological:     General: No focal deficit present.     Mental Status: She is alert.     Sensory: No sensory deficit.     Motor: No weakness.     ED Results / Procedures / Treatments   Labs (all labs ordered are listed, but only abnormal results are displayed) Labs Reviewed  COMPREHENSIVE METABOLIC PANEL - Abnormal; Notable for the following components:      Result Value   Potassium 3.4 (*)    Glucose, Bld 100 (*)    All other components within normal limits  URINALYSIS, ROUTINE W REFLEX MICROSCOPIC - Abnormal; Notable for the following components:   APPearance HAZY (*)    All other components within normal limits   CBC WITH DIFFERENTIAL/PLATELET  PREGNANCY, URINE    EKG None  Radiology CT ABDOMEN PELVIS W CONTRAST  Result Date: 05/20/2020 CLINICAL DATA:  25 year old female with history of left lower quadrant abdominal pain worsening over the past 2 weeks. History of endometriosis. EXAM: CT ABDOMEN AND PELVIS WITH CONTRAST  TECHNIQUE: Multidetector CT imaging of the abdomen and pelvis was performed using the standard protocol following bolus administration of intravenous contrast. CONTRAST:  OMNIPAQUE IOHEXOL 300 MG/ML  SOLN COMPARISON:  No priors. FINDINGS: Lower chest: Unremarkable. Hepatobiliary: No suspicious cystic or solid hepatic lesions. No intra or extrahepatic biliary ductal dilatation. Gallbladder is normal in appearance. Pancreas: No pancreatic mass. No pancreatic ductal dilatation. No pancreatic or peripancreatic fluid collections or inflammatory changes. Spleen: Unremarkable. Adrenals/Urinary Tract: Bilateral kidneys and adrenal glands are normal in appearance. No hydroureteronephrosis. Urinary bladder is normal in appearance. Stomach/Bowel: Normal appearance of the stomach. No pathologic dilatation of small bowel or colon. Status post appendectomy. Vascular/Lymphatic: No significant atherosclerotic disease, aneurysm or dissection noted in the abdominal or pelvic vasculature. No lymphadenopathy noted in the abdomen or pelvis. Reproductive: Uterus and ovaries are unremarkable in appearance. Other: No significant volume of ascites.  No pneumoperitoneum. Musculoskeletal: There are no aggressive appearing lytic or blastic lesions noted in the visualized portions of the skeleton. IMPRESSION: 1. No acute findings are noted in the abdomen or pelvis to account for the patient's symptoms. 2. Status post appendectomy. Electronically Signed   By: Trudie Reed M.D.   On: 05/20/2020 11:11    Procedures Procedures (including critical care time)  Medications Ordered in ED Medications - No data to  display  ED Course  I have reviewed the triage vital signs and the nursing notes.  Pertinent labs & imaging results that were available during my care of the patient were reviewed by me and considered in my medical decision making (see chart for details).    MDM Rules/Calculators/A&P                          Pt here with hx of amenorrhea and history and hx of endometriosis x 1 year, reports having chronic left abdominal pain.  Not currently on birth control.    On exam, pt is well appearing.  No acute distress.  She has minimal left abdominal tenderness on exam.  No peritoneal signs.  Has some relief with taking ibuprofen.  Clinical suspicion for ovarian torsion low given lack of clinical findings and duration of sx's.  preg negative.    Labs and CT abd/pelvis reassuring.  On recheck, pt sitting up on stretcher talking with family member at bedside.  I feel that she is appropriate for d/c home, discussed strict return precautions and she agrees to arrange close GYN f/u   Final Clinical Impression(s) / ED Diagnoses Final diagnoses:  Left lower quadrant abdominal pain  Amenorrhea    Rx / DC Orders ED Discharge Orders    None       Pauline Aus, PA-C 05/21/20 1347    Pricilla Loveless, MD 05/22/20 [phone]

## 2020-05-20 NOTE — Discharge Instructions (Addendum)
Your blood work and CT today was reassuring.  Please call your gynecologist tomorrow to arrange a follow-up appointment.  Do not take any Advil, Motrin, Aleve, aspirin or ibuprofen while taking the diclofenac.  You may also try applying warm heat on and off to your lower abdomen.  Return to the emergency department if you develop worsening pain, fever or vomiting

## 2020-08-06 ENCOUNTER — Ambulatory Visit: Payer: BC Managed Care – PPO | Admitting: Obstetrics and Gynecology

## 2020-08-06 NOTE — Progress Notes (Deleted)
25 y.o. G21P0000 Married Caucasian female here for annual exam.    PCP:     No LMP recorded. Patient has had an injection.            Sexually active: {yes no:314532}  The current method of family planning is {contraception:315051}.    Exercising: {yes no:314532}  {types:19826} Smoker:  no  Health Maintenance: Pap: 06/2017 normal History of abnormal Pap:  no MMG:  n/a Colonoscopy:  n/a BMD:   n/a  Result  n/a TDaP:  ***unsure Gardasil:   Yes, completed HIV:10/2018 Neg Hep C:10/2018 Neg Screening Labs:  Hb today: ***, Urine today: ***   reports that she has never smoked. She has never used smokeless tobacco. She reports that she does not drink alcohol and does not use drugs.  Past Medical History:  Diagnosis Date  . Amenorrhea   . Anxiety   . Asthma   . COVID-19 2021  . Dysmenorrhea   . Functional ovarian cysts   . Heart murmur    functional heart murmur--advised not to give blood    Past Surgical History:  Procedure Laterality Date  . APPENDECTOMY  2016  . TONSILLECTOMY AND ADENOIDECTOMY  2001    Current Outpatient Medications  Medication Sig Dispense Refill  . albuterol (VENTOLIN HFA) 108 (90 Base) MCG/ACT inhaler Inhale 1 puff into the lungs as needed.    . diclofenac (VOLTAREN) 75 MG EC tablet Take 1 tablet (75 mg total) by mouth 2 (two) times daily. Take with food 14 tablet 0  . EPINEPHrine (EPIPEN 2-PAK) 0.3 mg/0.3 mL IJ SOAJ injection Inject 0.3 mg into the muscle as needed for anaphylaxis.    Marland Kitchen levonorgestrel (MIRENA, 52 MG,) 20 MCG/24HR IUD 1 each by Intrauterine route once.    . sertraline (ZOLOFT) 100 MG tablet Take 2 tablets by mouth daily.     No current facility-administered medications for this visit.    Family History  Problem Relation Age of Onset  . Thyroid disease Mother        hypothyroid  . Diabetes Father   . Diabetes Maternal Grandmother   . Cancer Maternal Grandfather 27       colon ca  . Ovarian cancer Paternal Grandmother 65        dec ovarian ca  . Diabetes Paternal Grandfather   . Heart attack Paternal Grandfather   . Breast cancer Other     Review of Systems  Exam:   There were no vitals taken for this visit.    General appearance: alert, cooperative and appears stated age Head: normocephalic, without obvious abnormality, atraumatic Neck: no adenopathy, supple, symmetrical, trachea midline and thyroid normal to inspection and palpation Lungs: clear to auscultation bilaterally Breasts: normal appearance, no masses or tenderness, No nipple retraction or dimpling, No nipple discharge or bleeding, No axillary adenopathy Heart: regular rate and rhythm Abdomen: soft, non-tender; no masses, no organomegaly Extremities: extremities normal, atraumatic, no cyanosis or edema Skin: skin color, texture, turgor normal. No rashes or lesions Lymph nodes: cervical, supraclavicular, and axillary nodes normal. Neurologic: grossly normal  Pelvic: External genitalia:  no lesions              No abnormal inguinal nodes palpated.              Urethra:  normal appearing urethra with no masses, tenderness or lesions              Bartholins and Skenes: normal  Vagina: normal appearing vagina with normal color and discharge, no lesions              Cervix: no lesions              Pap taken: {yes no:314532} Bimanual Exam:  Uterus:  normal size, contour, position, consistency, mobility, non-tender              Adnexa: no mass, fullness, tenderness              Rectal exam: {yes no:314532}.  Confirms.              Anus:  normal sphincter tone, no lesions  Chaperone was present for exam.  Assessment:   Well woman visit with normal exam.   Plan: Mammogram screening discussed. Self breast awareness reviewed. Pap and HR HPV as above. Guidelines for Calcium, Vitamin D, regular exercise program including cardiovascular and weight bearing exercise.   Follow up annually and prn.   Additional counseling given.  {yes  [id number]. _______ minutes face to face time of which over 50% was spent in counseling.    After visit summary provided.

## 2020-09-12 ENCOUNTER — Encounter (HOSPITAL_COMMUNITY): Payer: Self-pay

## 2020-09-12 ENCOUNTER — Emergency Department (HOSPITAL_COMMUNITY)
Admission: EM | Admit: 2020-09-12 | Discharge: 2020-09-12 | Disposition: A | Discharge status: Home / Self Care | Payer: Self-pay | Attending: Emergency Medicine | Admitting: Emergency Medicine

## 2020-09-12 ENCOUNTER — Other Ambulatory Visit: Payer: Self-pay

## 2020-09-12 ENCOUNTER — Emergency Department (HOSPITAL_COMMUNITY): Payer: Self-pay

## 2020-09-12 DIAGNOSIS — M722 Plantar fascial fibromatosis: Secondary | ICD-10-CM | POA: Insufficient documentation

## 2020-09-12 DIAGNOSIS — J45909 Unspecified asthma, uncomplicated: Secondary | ICD-10-CM | POA: Insufficient documentation

## 2020-09-12 DIAGNOSIS — Z8616 Personal history of COVID-19: Secondary | ICD-10-CM | POA: Insufficient documentation

## 2020-09-12 DIAGNOSIS — R52 Pain, unspecified: Secondary | ICD-10-CM

## 2020-09-12 DIAGNOSIS — Z91040 Latex allergy status: Secondary | ICD-10-CM | POA: Insufficient documentation

## 2020-09-12 NOTE — ED Triage Notes (Signed)
Pt to er, pt states that she went to the hospital in [place] and was seen for the l foot pain, states that she was seen 2-3 weeks ago. States that she was given an air splint and told to follow up, pt states that she doesn't have insurance so she didn't follow up and is here today because she is still having pain.  Pt doesn't have splint in place at this time.

## 2020-09-12 NOTE — Discharge Instructions (Signed)
Your x-ray today did not show any evidence of broken bones.  Your symptoms are likely related to plan of fasciitis.  It is important that you continue to wear supportive shoes.  Apply ice and elevate your foot when possible.  Wear the ankle brace when walking or standing.  You may contact the podiatrist (foot doctor) listed to arrange a follow-up appointment.  Continue taking your ibuprofen, 800 mg 3 times a day with food.

## 2020-09-12 NOTE — ED Provider Notes (Signed)
[facility] EMERGENCY DEPARTMENT Provider Note   CSN: [id number] Arrival date & time: 09/12/20  1655     History Chief Complaint  Patient presents with  . Foot Pain    Tamara Carroll is a 25 y.o. female.  HPI      Tamara Carroll is a 25 y.o. female who presents to the Emergency Department complaining of persistent left foot and ankle pain x 2 to 3 weeks.  She states that she was initially seen at another emergency department for her symptoms and was given an Aircast and advised to take ibuprofen.  She states that she has been wearing the Aircast daily, but pain is not improving.  States that she does not currently have health insurance and is here for repeat evaluation because she has been unable to see an orthopedic provider.  She describes an aching pain to the arch of her foot that radiates to her ankle.  Pain is worse with walking or standing.  States that her job requires standing and walking all day.  She denies discoloration of her foot, known injury, numbness or tingling of her toes.   Past Medical History:  Diagnosis Date  . Amenorrhea   . Anxiety   . Asthma   . COVID-19 2021  . Dysmenorrhea   . Functional ovarian cysts   . Heart murmur    functional heart murmur--advised not to give blood    There are no problems to display for this patient.   Past Surgical History:  Procedure Laterality Date  . APPENDECTOMY  2016  . TONSILLECTOMY AND ADENOIDECTOMY  2001     OB History    Gravida  0   Para  0   Term  0   Preterm  0   AB  0   Living  0     SAB  0   IAB  0   Ectopic  0   Multiple  0   Live Births  0           Family History  Problem Relation Age of Onset  . Thyroid disease Mother        hypothyroid  . Diabetes Father   . Diabetes Maternal Grandmother   . Cancer Maternal Grandfather 65       colon ca  . Ovarian cancer Paternal Grandmother 39       dec ovarian ca  . Diabetes Paternal Grandfather   . Heart attack  Paternal Grandfather   . Breast cancer Other     Social History   Tobacco Use  . Smoking status: Never Smoker  . Smokeless tobacco: Never Used  Vaping Use  . Vaping Use: Never used  Substance Use Topics  . Alcohol use: Never  . Drug use: Never    Home Medications Prior to Admission medications   Medication Sig Start Date End Date Taking? Authorizing Provider  albuterol (VENTOLIN HFA) 108 (90 Base) MCG/ACT inhaler Inhale 1 puff into the lungs as needed. 10/01/15   [provider]  diclofenac (VOLTAREN) 75 MG EC tablet Take 1 tablet (75 mg total) by mouth 2 (two) times daily. Take with food 05/20/20   Dann Galicia, PA-C  EPINEPHrine (EPIPEN 2-PAK) 0.3 mg/0.3 mL IJ SOAJ injection Inject 0.3 mg into the muscle as needed for anaphylaxis.    [provider]  levonorgestrel (MIRENA, 52 MG,) 20 MCG/24HR IUD 1 each by Intrauterine route once.    [provider]  sertraline (ZOLOFT) 100 MG tablet  Take 2 tablets by mouth daily. 05/19/18   [provider]    Allergies    Peanut oil, Penicillins, Latex, and Other  Review of Systems   Review of Systems  Constitutional: Negative for chills and fever.  Respiratory: Negative for shortness of breath.   Gastrointestinal: Negative for nausea and vomiting.  Musculoskeletal: Positive for arthralgias (Left foot and ankle pain). Negative for joint swelling.  Skin: Negative for color change and wound.  Neurological: Negative for dizziness, weakness and numbness.  Psychiatric/Behavioral: Negative for confusion.    Physical Exam Updated Vital Signs BP 121/66 (BP Location: Right Arm)   Pulse 94   Temp 98.7 F (37.1 C) (Oral)   Resp 20   Ht 4\' 11"  (1.499 m)   Wt 90.7 kg   SpO2 97%   BMI 40.40 kg/m   Physical Exam Vitals and nursing note reviewed.  Constitutional:      General: She is not in acute distress.    Appearance: Normal appearance. She is not ill-appearing.  Cardiovascular:     Rate and Rhythm:  Normal rate and regular rhythm.     Pulses: Normal pulses.  Pulmonary:     Effort: Pulmonary effort is normal.  Musculoskeletal:        General: Tenderness present. No swelling, deformity or signs of injury.     Right lower leg: No edema.     Left lower leg: No edema.     Comments: Tender to palpation of the mid and lateral left foot.  No edema, ecchymosis or excessive warmth.  No tenderness or edema proximal to the ankle.  Skin:    General: Skin is warm.     Capillary Refill: Capillary refill takes less than 2 seconds.     Findings: No bruising, erythema, lesion or rash.  Neurological:     General: No focal deficit present.     Mental Status: She is alert.     Sensory: No sensory deficit.     Motor: No weakness.     ED Results / Procedures / Treatments   Labs (all labs ordered are listed, but only abnormal results are displayed) Labs Reviewed - No data to display  EKG None  Radiology DG Ankle Complete Left  Result Date: 09/12/2020 CLINICAL DATA:  Left ankle pain EXAM: LEFT ANKLE COMPLETE - 3+ VIEW COMPARISON:  08/02/2020 FINDINGS: There is no evidence of fracture, dislocation, or joint effusion. There is no evidence of arthropathy or other focal bone abnormality. Soft tissues are unremarkable. IMPRESSION: Negative. Electronically Signed   By: 10/02/2020 M.D.   On: 09/12/2020 18:17    Procedures Procedures   Medications Ordered in ED Medications - No data to display  ED Course  I have reviewed the triage vital signs and the nursing notes.  Pertinent labs & imaging results that were available during my care of the patient were reviewed by me and considered in my medical decision making (see chart for details).    MDM Rules/Calculators/A&P                          Patient here for evaluation of persistent left foot pain.  Seen in another hospital 2 to 3 weeks ago at onset of her symptoms.  Continues to stand and walk at her job.  No known injury.  On review of  medical records, patient was seen for left ankle pain at [facility] on 08/02/2020.  Had negative x-ray at that  time.  On exam today, patient does have tenderness of the mid and lateral foot.  Repeat x-ray today reassuring.  Neurovascularly intact.  Symptoms felt to be consistent with plantar fasciitis.  I feel that she is appropriate for discharge home, advised to wear supportive shoes, ice and continue nonsteroidal.  Will give referral information for podiatry.  ASO brace given for support.    The patient appears reasonably screened and/or stabilized for discharge and I doubt any other medical condition or other [facility] requiring further screening, evaluation, or treatment in the ED at this time prior to discharge.    Final Clinical Impression(s) / ED Diagnoses Final diagnoses:  Pain  Plantar fasciitis of left foot    Rx / DC Orders ED Discharge Orders    None       Rosey Bath 09/12/20 2103    Mancel Bale, MD 09/14/20 [phone]

## 2020-12-10 ENCOUNTER — Emergency Department (HOSPITAL_COMMUNITY)
Admission: EM | Admit: 2020-12-10 | Discharge: 2020-12-10 | Disposition: A | Discharge status: Home / Self Care | Payer: Self-pay | Attending: Emergency Medicine | Admitting: Emergency Medicine

## 2020-12-10 ENCOUNTER — Encounter (HOSPITAL_COMMUNITY): Payer: Self-pay

## 2020-12-10 ENCOUNTER — Other Ambulatory Visit: Payer: Self-pay

## 2020-12-10 ENCOUNTER — Emergency Department (HOSPITAL_COMMUNITY): Payer: Self-pay

## 2020-12-10 DIAGNOSIS — Z8616 Personal history of COVID-19: Secondary | ICD-10-CM | POA: Insufficient documentation

## 2020-12-10 DIAGNOSIS — Z91010 Allergy to peanuts: Secondary | ICD-10-CM | POA: Insufficient documentation

## 2020-12-10 DIAGNOSIS — Z20822 Contact with and (suspected) exposure to covid-19: Secondary | ICD-10-CM | POA: Insufficient documentation

## 2020-12-10 DIAGNOSIS — J40 Bronchitis, not specified as acute or chronic: Secondary | ICD-10-CM | POA: Insufficient documentation

## 2020-12-10 DIAGNOSIS — Z91040 Latex allergy status: Secondary | ICD-10-CM | POA: Insufficient documentation

## 2020-12-10 MED ORDER — AZITHROMYCIN 250 MG PO TABS: 250.0000 mg | ORAL_TABLET | Freq: Every day | ORAL | 0 refills | Status: DC

## 2020-12-10 MED ORDER — PREDNISONE 50 MG PO TABS: 50.0000 mg | ORAL_TABLET | Freq: Every day | ORAL | 0 refills | Status: AC

## 2020-12-10 MED ORDER — BENZONATATE 100 MG PO CAPS: 100.0000 mg | ORAL_CAPSULE | Freq: Three times a day (TID) | ORAL | 0 refills | Status: DC

## 2020-12-10 NOTE — ED Provider Notes (Signed)
[facility] EMERGENCY DEPARTMENT Provider Note   CSN: [id number] Arrival date & time: 12/10/20  1709     History Chief Complaint  Patient presents with   Shortness of Breath    Tamara Carroll is a 25 y.o. female with past medical history significant for recurrent bronchitis who presents for evaluation of cough and shortness of breath.  Has also had a scratchy throat.  Patient works at [employer] and has had exposures.  She had a rapid COVID test yesterday which was negative.  Cough is productive of clear sputum.  No unilateral leg swelling, redness or warmth.  No pleuritic cough.  No history of PE or DVT.  No fever, chills, back pain, abdominal pain, exertional chest pain, pleuritic chest pain.  No unilateral leg swelling, redness or warmth.  Denies additional aggravating or alleviating factors  History obtained from patient and past medical records.  No interpreter used.  HPI     Past Medical History:  Diagnosis Date   Amenorrhea    Anxiety    Asthma    COVID-19 2021   Dysmenorrhea    Functional ovarian cysts    Heart murmur    functional heart murmur--advised not to give blood    There are no problems to display for this patient.   Past Surgical History:  Procedure Laterality Date   APPENDECTOMY  2016   TONSILLECTOMY AND ADENOIDECTOMY  2001     OB History     Gravida  0   Para  0   Term  0   Preterm  0   AB  0   Living  0      SAB  0   IAB  0   Ectopic  0   Multiple  0   Live Births  0           Family History  Problem Relation Age of Onset   Thyroid disease Mother        hypothyroid   Diabetes Father    Diabetes Maternal Grandmother    Cancer Maternal Grandfather [id number]       colon ca   Ovarian cancer Paternal Grandmother 1965       dec ovarian ca   Diabetes Paternal Grandfather    Heart attack Paternal Grandfather    Breast cancer Other     Social History   Tobacco Use   Smoking status: Never   Smokeless  tobacco: Never  Vaping Use   Vaping Use: Never used  Substance Use Topics   Alcohol use: Never   Drug use: Never    Home Medications Prior to Admission medications   Medication Sig Start Date End Date Taking? Authorizing Provider  azithromycin (ZITHROMAX) 250 MG tablet Take 1 tablet (250 mg total) by mouth daily. Take first 2 tablets together, then 1 every day until finished. 12/10/20  Yes Paralee Pendergrass A, PA-C  benzonatate (TESSALON) 100 MG capsule Take 1 capsule (100 mg total) by mouth every 8 (eight) hours. 12/10/20  Yes Merrick Maggio A, PA-C  predniSONE (DELTASONE) 50 MG tablet Take 1 tablet (50 mg total) by mouth daily for 5 days. 12/10/20 12/15/20 Yes Aralynn Brake A, PA-C  albuterol (VENTOLIN HFA) 108 (90 Base) MCG/ACT inhaler Inhale 1 puff into the lungs as needed. 10/01/15   [provider]  diclofenac (VOLTAREN) 75 MG EC tablet Take 1 tablet (75 mg total) by mouth 2 (two) times daily. Take with food 05/20/20   Pauline Ausriplett, Tammy, PA-C  EPINEPHrine (EPIPEN 2-PAK) 0.3 mg/0.3 mL IJ SOAJ injection Inject 0.3 mg into the muscle as needed for anaphylaxis.    [provider]  levonorgestrel (MIRENA, 52 MG,) 20 MCG/24HR IUD 1 each by Intrauterine route once.    [provider]  sertraline (ZOLOFT) 100 MG tablet Take 2 tablets by mouth daily. 05/19/18   [provider]    Allergies    Peanut oil, Penicillins, Latex, and Other  Review of Systems   Review of Systems  Constitutional: Negative.   HENT:  Positive for congestion, postnasal drip, rhinorrhea and sore throat. Negative for tinnitus, trouble swallowing and voice change.   Respiratory:  Positive for cough and shortness of breath. Negative for apnea, choking, chest tightness, wheezing and stridor.   Cardiovascular: Negative.   Gastrointestinal: Negative.   Genitourinary: Negative.   Musculoskeletal: Negative.   Skin: Negative.   Neurological: Negative.   All other systems reviewed and are  negative.  Physical Exam Updated Vital Signs BP 119/79 (BP Location: Left Arm)   Pulse 74   Temp 98.6 F (37 C) (Oral)   Resp 17   Ht 4\' 11"  (1.499 m)   Wt 79.4 kg   LMP 12/04/2020 (Exact Date)   SpO2 100%   BMI 35.35 kg/m   Physical Exam Vitals and nursing note reviewed.  Constitutional:      General: She is not in acute distress.    Appearance: She is well-developed. She is not ill-appearing, toxic-appearing or diaphoretic.  HENT:     Head: Normocephalic and atraumatic.     Mouth/Throat:     Mouth: Mucous membranes are moist.     Pharynx: Oropharynx is clear. Uvula midline.     Tonsils: No tonsillar exudate or tonsillar abscesses.  Eyes:     Pupils: Pupils are equal, round, and reactive to light.  Cardiovascular:     Rate and Rhythm: Normal rate.     Pulses: Normal pulses.          Radial pulses are 2+ on the right side and 2+ on the left side.     Heart sounds: Normal heart sounds.  Pulmonary:     Effort: No tachypnea or respiratory distress.     Breath sounds: Wheezing present. No rhonchi or rales.     Comments: Mild wheeze. No respiratory distress. Speaks in full sentences without difficulty Chest:     Comments: Equal rise and fall to chest wall Abdominal:     General: Bowel sounds are normal. There is no distension.     Palpations: Abdomen is soft.  Musculoskeletal:        General: Normal range of motion.     Cervical back: Normal range of motion.     Right lower leg: No tenderness. No edema.     Left lower leg: No tenderness. No edema.  Skin:    General: Skin is warm and dry.     Capillary Refill: Capillary refill takes less than 2 seconds.  Neurological:     General: No focal deficit present.     Mental Status: She is alert.  Psychiatric:        Mood and Affect: Mood normal.    ED Results / Procedures / Treatments   Labs (all labs ordered are listed, but only abnormal results are displayed) Labs Reviewed  SARS CORONAVIRUS 2 (TAT 6-24 HRS)     EKG EKG Interpretation  Date/Time:  Monday December 10 2020 17:55:31 EDT Ventricular Rate:  79 PR Interval:  140  QRS Duration: 74 QT Interval:  392 QTC Calculation: 449 R Axis:   36 Text Interpretation: Sinus rhythm with frequent Premature ventricular complexes Otherwise normal ECG No old tracing for comparison Confirmed by Alona Bene [phone]) on 12/10/2020 6:09:35 PM  Radiology DG Chest 2 View  Result Date: 12/10/2020 CLINICAL DATA:  Shortness of breath cough congestion EXAM: CHEST - 2 VIEW COMPARISON:  None. FINDINGS: Central airways thickening. No focal opacity or pleural effusion. Normal cardiac size. No pneumothorax IMPRESSION: Bronchitic changes without focal airspace consolidation. Electronically Signed   By: Jasmine Pang M.D.   On: 12/10/2020 19:05    Procedures Procedures   Medications Ordered in ED Medications - No data to display  ED Course  I have reviewed the triage vital signs and the nursing notes.  Pertinent labs & imaging results that were available during my care of the patient were reviewed by me and considered in my medical decision making (see chart for details).  25 year old here for evaluation of cough and shortness of breath.  Afebrile, nonseptic, not ill-appearing.  Does have mild expiratory wheeze heard no acute respiratory distress.  History of recurrent bronchitis.  Posterior pharynx clear.  Low suspicion for strep pharyngitis, PTA, RPA. Has some congestion and rhinorrhea.  No clinical evidence of DVT on exam.  She is PERC negative, Wells criteria low risk.  Outpatient rapid COVID test negative.  Chest x-ray here shows bronchitis.  EKG without ischemic changes.  Will obtain COVID PCR.  Will treat as bronchitis.  Discussed follow-up with PCP in 2 days for reevaluation, return for new or worsening symptoms.  The patient has been appropriately medically screened and/or stabilized in the ED. I have low suspicion for any other emergent medical condition which  would require further screening, evaluation or treatment in the ED or require inpatient management.  Patient is hemodynamically stable and in no acute distress.  Patient able to ambulate in department prior to ED.  Evaluation does not show acute pathology that would require ongoing or additional emergent interventions while in the emergency department or further inpatient treatment.  I have discussed the diagnosis with the patient and answered all questions.  Pain is been managed while in the emergency department and patient has no further complaints prior to discharge.  Patient is comfortable with plan discussed in room and is stable for discharge at this time.  I have discussed strict return precautions for returning to the emergency department.  Patient was encouraged to follow-up with PCP/specialist refer to at discharge.     MDM Rules/Calculators/A&P                           Tamara Carroll was evaluated in Emergency Department on 12/10/2020 for the symptoms described in the history of present illness. She was evaluated in the context of the global COVID-19 pandemic, which necessitated consideration that the patient might be at risk for infection with the SARS-CoV-2 virus that causes COVID-19. Institutional protocols and algorithms that pertain to the evaluation of patients at risk for COVID-19 are in a state of rapid change based on information released by regulatory bodies including the CDC and federal and state organizations. These policies and algorithms were followed during the patient's care in the ED.  Final Clinical Impression(s) / ED Diagnoses Final diagnoses:  Bronchitis    Rx / DC Orders ED Discharge Orders          Ordered    predniSONE (DELTASONE) 50  MG tablet  Daily        12/10/20 2010    azithromycin (ZITHROMAX) 250 MG tablet  Daily        12/10/20 2010    benzonatate (TESSALON) 100 MG capsule  Every 8 hours        12/10/20 2010             Willim Turnage A,  PA-C 12/10/20 2011    Maia Plan, MD 12/13/20 [phone]

## 2020-12-10 NOTE — ED Triage Notes (Addendum)
Pt. States they have had bronchitis on and off since they were born. Pt. States they feel SHOB. Pt. Has a cough. Pt. Works at [employer] so pt. Has been around covid. Pt. States they were tested yesterday and it was negative. Pt. States they started with a sore throat on Thursday and than began coughing on Friday.

## 2020-12-10 NOTE — Discharge Instructions (Addendum)
COVID test is pending at discharge.  You may follow-up on results on MyChart or you will be called if your testing is positive.  We are treating for bronchitis.  I have started you on antibiotics, steroid as well as Tessalon Perles for cough.  Return for new or worsening symptoms otherwise follow-up with primary care provider in 1 to 2 days for reevaluation

## 2020-12-11 LAB — SARS CORONAVIRUS 2 (TAT 6-24 HRS): SARS Coronavirus 2: NEGATIVE

## 2021-05-29 IMAGING — CT CT ABD-PELV W/ CM
2 of 4 series · 16 of 46 positions shown, 18 images · IV contrast (Omnipaque or Isovue)
Comparison: No priors.

CLINICAL DATA: 24-year-old female with history of left lower
quadrant abdominal pain worsening over the past 2 weeks. History of
endometriosis.

EXAM:
CT ABDOMEN AND PELVIS WITH CONTRAST
TECHNIQUE: Multidetector CT imaging of the abdomen and pelvis was performed
using the standard protocol following bolus administration of
intravenous contrast.
CONTRAST:  100mL OMNIPAQUE IOHEXOL 300 MG/ML  SOLN

[Series 2: axial st · axial · 0.80mm/px · z∈[+718,+1128]mm · 13 of 90 slices shown, 15 images]
[im 4/90  soft-tissue]
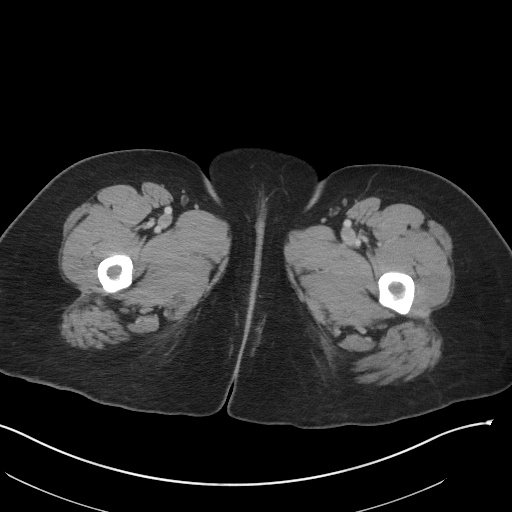
[im 4/90  bone]
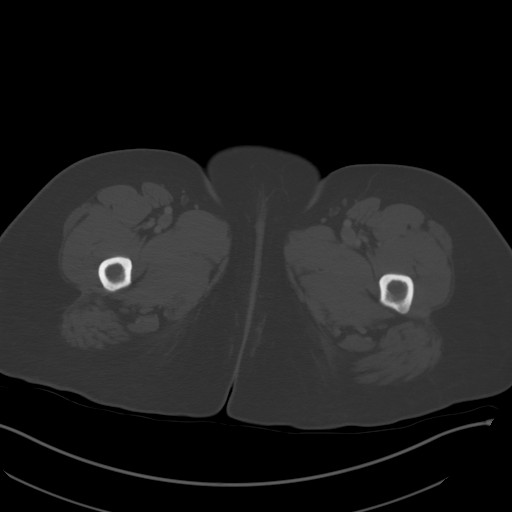
[im 12/90  soft-tissue]
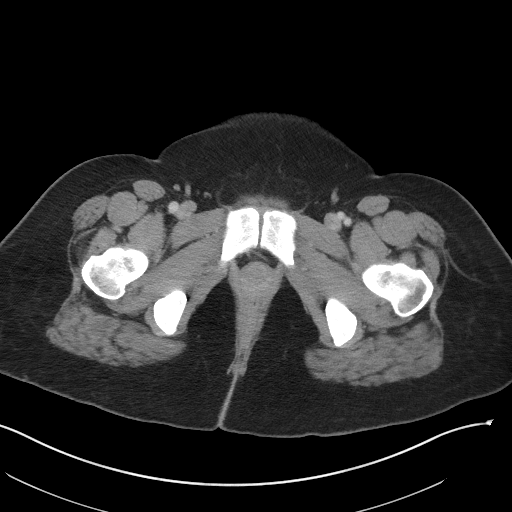
[im 20/90  soft-tissue]
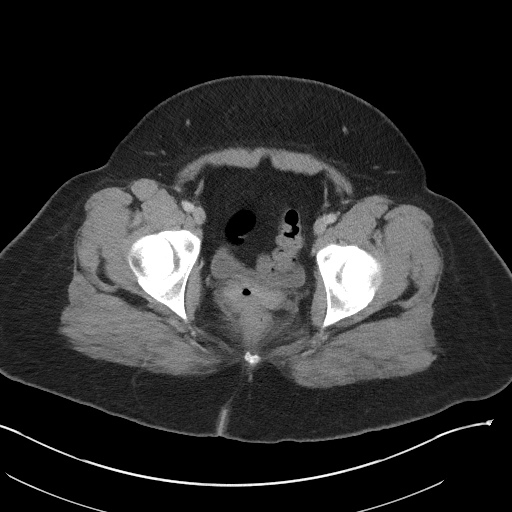
[im 24/90  soft-tissue]
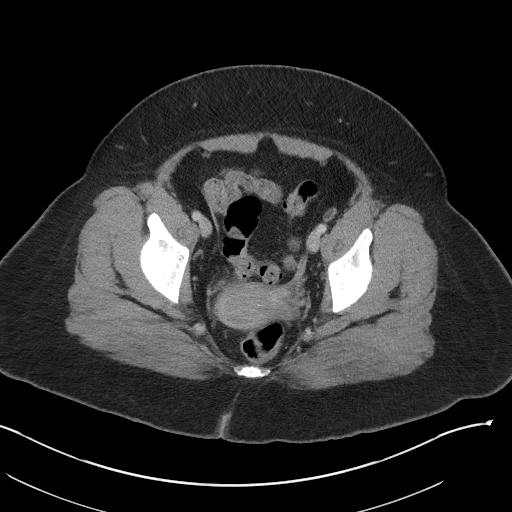
[im 31/90  soft-tissue]
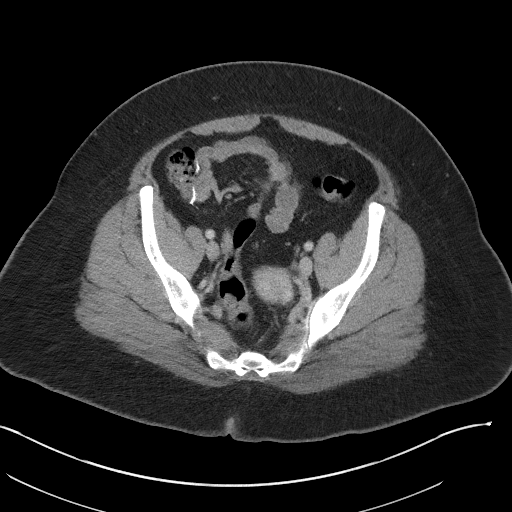
[im 39/90  soft-tissue]
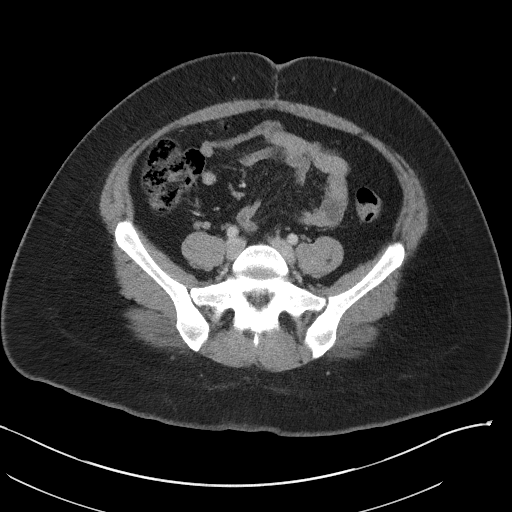
[im 47/90  soft-tissue]
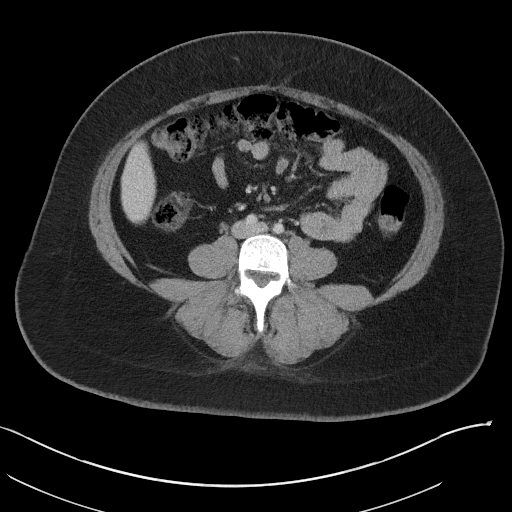
[im 51/90  soft-tissue]
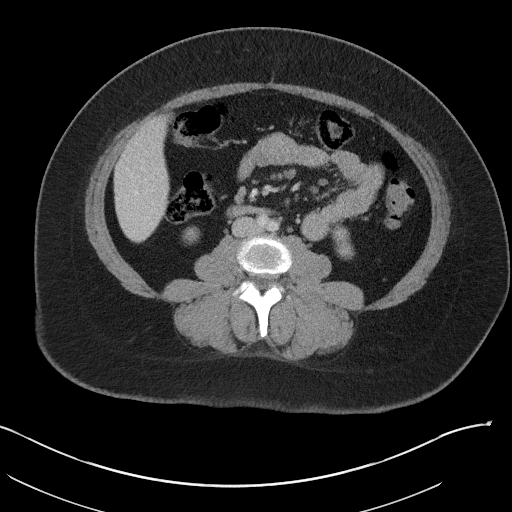
[im 59/90  soft-tissue]
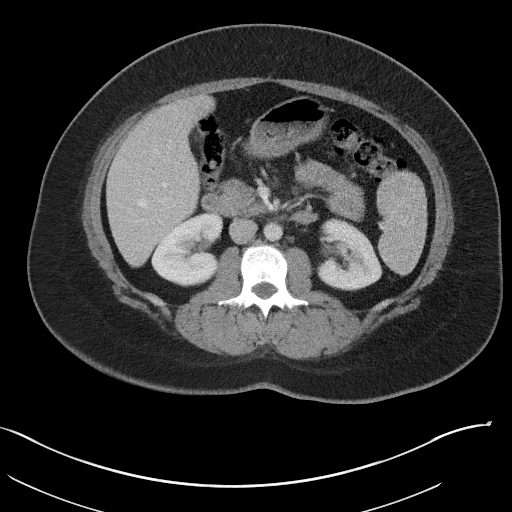
[im 59/90  bone]
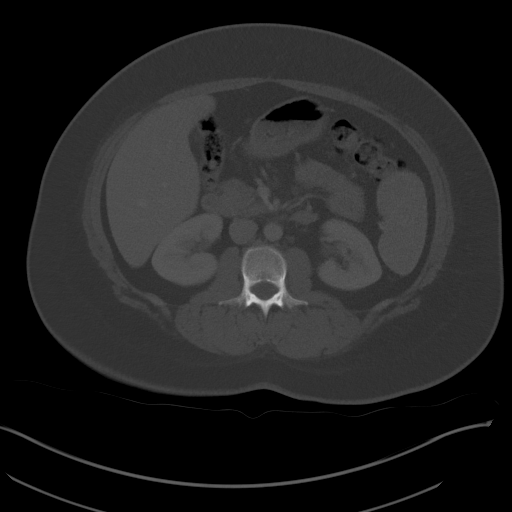
[im 66/90  soft-tissue]
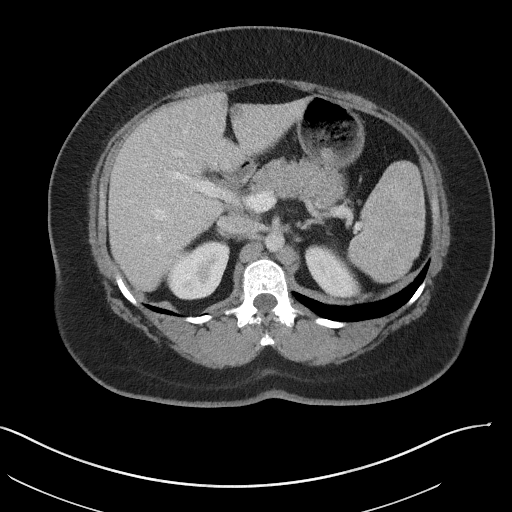
[im 70/90  soft-tissue]
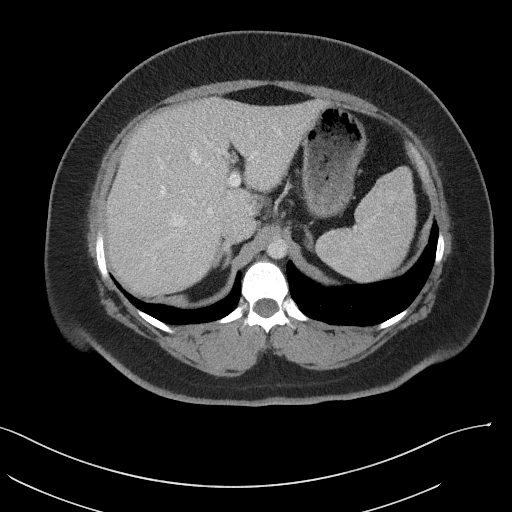
[im 78/90  soft-tissue]
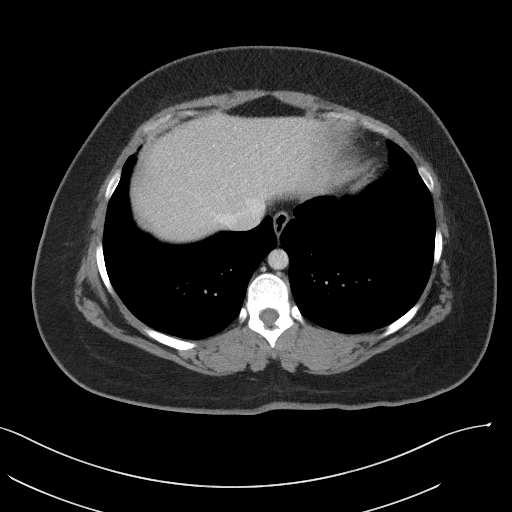
[im 86/90  soft-tissue]
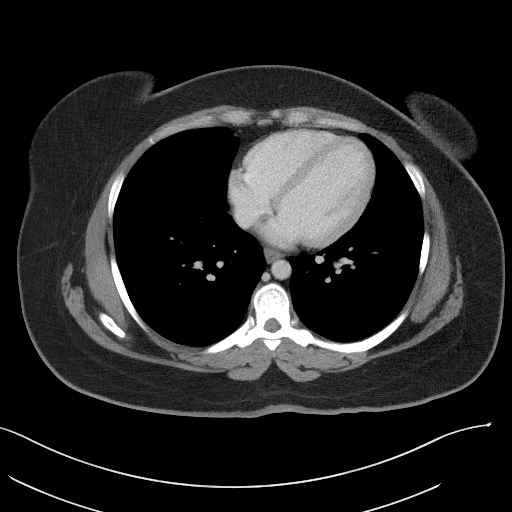

[Series 5: coronal st · coronal · 0.78mm/px · 3 of 121 slices shown]
[im 41/121  soft-tissue]
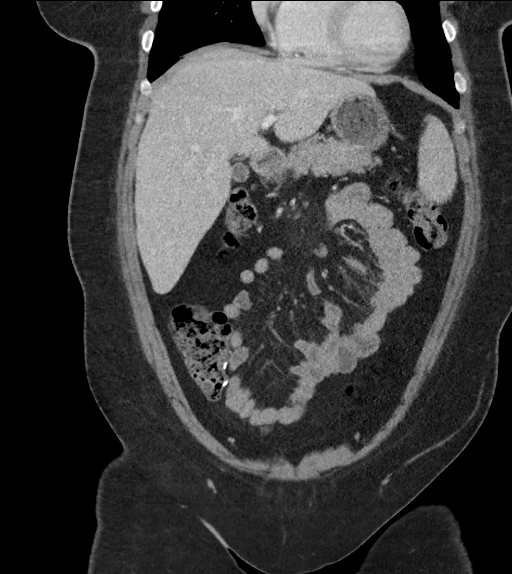
[im 54/121  soft-tissue]
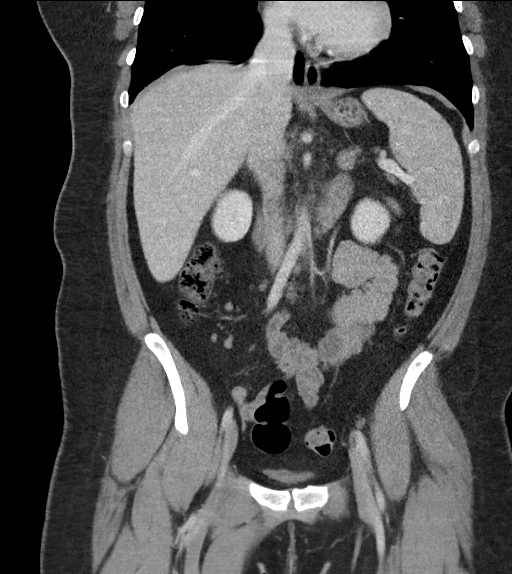
[im 67/121  soft-tissue]
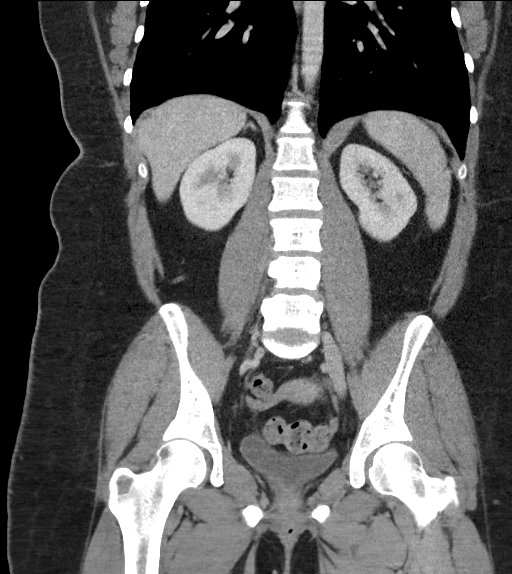

[16 of 46 positions shown; findings below may reference images not displayed]

FINDINGS: Lower chest: Unremarkable.

Hepatobiliary: No suspicious cystic or solid hepatic lesions. No
intra or extrahepatic biliary ductal dilatation. Gallbladder is
normal in appearance.

Pancreas: No pancreatic mass. No pancreatic ductal dilatation. No
pancreatic or peripancreatic fluid collections or inflammatory
changes.

Spleen: Unremarkable.

Adrenals/Urinary Tract: Bilateral kidneys and adrenal glands are
normal in appearance. No hydroureteronephrosis. Urinary bladder is
normal in appearance.

Stomach/Bowel: Normal appearance of the stomach. No pathologic
dilatation of small bowel or colon. Status post appendectomy.

Vascular/Lymphatic: No significant atherosclerotic disease, aneurysm
or dissection noted in the abdominal or pelvic vasculature. No
lymphadenopathy noted in the abdomen or pelvis.

Reproductive: Uterus and ovaries are unremarkable in appearance.

Other: No significant volume of ascites.  No pneumoperitoneum.

Musculoskeletal: There are no aggressive appearing lytic or blastic
lesions noted in the visualized portions of the skeleton.
IMPRESSION: 1. No acute findings are noted in the abdomen or pelvis to account
for the patient's symptoms.
2. Status post appendectomy.

## 2021-07-22 DIAGNOSIS — N941 Unspecified dyspareunia: Secondary | ICD-10-CM | POA: Diagnosis not present

## 2021-07-22 DIAGNOSIS — N920 Excessive and frequent menstruation with regular cycle: Secondary | ICD-10-CM | POA: Diagnosis not present

## 2021-07-22 DIAGNOSIS — N946 Dysmenorrhea, unspecified: Secondary | ICD-10-CM | POA: Diagnosis not present

## 2021-07-22 DIAGNOSIS — Z6841 Body Mass Index (BMI) 40.0 and over, adult: Secondary | ICD-10-CM | POA: Diagnosis not present

## 2021-07-22 DIAGNOSIS — L680 Hirsutism: Secondary | ICD-10-CM | POA: Diagnosis not present

## 2021-08-08 DIAGNOSIS — N920 Excessive and frequent menstruation with regular cycle: Secondary | ICD-10-CM | POA: Diagnosis not present

## 2021-08-08 DIAGNOSIS — R102 Pelvic and perineal pain: Secondary | ICD-10-CM | POA: Diagnosis not present

## 2021-08-08 DIAGNOSIS — N946 Dysmenorrhea, unspecified: Secondary | ICD-10-CM | POA: Diagnosis not present

## 2021-08-08 DIAGNOSIS — M6289 Other specified disorders of muscle: Secondary | ICD-10-CM | POA: Diagnosis not present

## 2021-08-08 DIAGNOSIS — N941 Unspecified dyspareunia: Secondary | ICD-10-CM | POA: Diagnosis not present

## 2021-08-12 DIAGNOSIS — E038 Other specified hypothyroidism: Secondary | ICD-10-CM | POA: Diagnosis not present

## 2021-08-12 DIAGNOSIS — N946 Dysmenorrhea, unspecified: Secondary | ICD-10-CM | POA: Diagnosis not present

## 2021-08-12 DIAGNOSIS — N941 Unspecified dyspareunia: Secondary | ICD-10-CM | POA: Diagnosis not present

## 2021-08-12 DIAGNOSIS — N920 Excessive and frequent menstruation with regular cycle: Secondary | ICD-10-CM | POA: Diagnosis not present

## 2021-08-14 DIAGNOSIS — M6289 Other specified disorders of muscle: Secondary | ICD-10-CM | POA: Diagnosis not present

## 2021-08-14 DIAGNOSIS — N941 Unspecified dyspareunia: Secondary | ICD-10-CM | POA: Diagnosis not present

## 2021-08-14 DIAGNOSIS — R102 Pelvic and perineal pain: Secondary | ICD-10-CM | POA: Diagnosis not present

## 2021-08-22 ENCOUNTER — Encounter (HOSPITAL_COMMUNITY): Payer: Self-pay | Admitting: *Deleted

## 2021-08-22 ENCOUNTER — Other Ambulatory Visit: Payer: Self-pay

## 2021-08-22 ENCOUNTER — Emergency Department (HOSPITAL_COMMUNITY)
Admission: EM | Admit: 2021-08-22 | Discharge: 2021-08-22 | Disposition: A | Discharge status: Home / Self Care | Payer: Self-pay | Attending: Emergency Medicine | Admitting: Emergency Medicine

## 2021-08-22 DIAGNOSIS — Z91040 Latex allergy status: Secondary | ICD-10-CM | POA: Insufficient documentation

## 2021-08-22 DIAGNOSIS — Z91010 Allergy to peanuts: Secondary | ICD-10-CM | POA: Insufficient documentation

## 2021-08-22 DIAGNOSIS — E039 Hypothyroidism, unspecified: Secondary | ICD-10-CM | POA: Insufficient documentation

## 2021-08-22 DIAGNOSIS — R040 Epistaxis: Secondary | ICD-10-CM | POA: Diagnosis not present

## 2021-08-22 DIAGNOSIS — R9431 Abnormal electrocardiogram [ECG] [EKG]: Secondary | ICD-10-CM | POA: Diagnosis not present

## 2021-08-22 LAB — CBC WITH DIFFERENTIAL/PLATELET
Abs Immature Granulocytes: 0.04 10*3/uL (ref 0.00–0.07)
Basophils Absolute: 0 10*3/uL (ref 0.0–0.1)
Basophils Relative: 0 %
Eosinophils Absolute: 0.1 10*3/uL (ref 0.0–0.5)
Eosinophils Relative: 1 %
HCT: 41.7 % (ref 36.0–46.0)
Hemoglobin: 14 g/dL (ref 12.0–15.0)
Immature Granulocytes: 1 %
Lymphocytes Relative: 21 %
Lymphs Abs: 1.8 10*3/uL (ref 0.7–4.0)
MCH: 28.7 pg (ref 26.0–34.0)
MCHC: 33.6 g/dL (ref 30.0–36.0)
MCV: 85.5 fL (ref 80.0–100.0)
Monocytes Absolute: 0.4 10*3/uL (ref 0.1–1.0)
Monocytes Relative: 5 %
Neutro Abs: 6.4 10*3/uL (ref 1.7–7.7)
Neutrophils Relative %: 72 %
Platelets: 258 10*3/uL (ref 150–400)
RBC: 4.88 MIL/uL (ref 3.87–5.11)
RDW: 12.2 % (ref 11.5–15.5)
WBC: 8.8 10*3/uL (ref 4.0–10.5)
nRBC: 0 % (ref 0.0–0.2)

## 2021-08-22 LAB — BASIC METABOLIC PANEL
Anion gap: 6 (ref 5–15)
BUN: 14 mg/dL (ref 6–20)
CO2: 27 mmol/L (ref 22–32)
Calcium: 9.2 mg/dL (ref 8.9–10.3)
Chloride: 106 mmol/L (ref 98–111)
Creatinine, Ser: 0.61 mg/dL (ref 0.44–1.00)
GFR, Estimated: >60 mL/min (ref >60)
Glucose, Bld: 96 mg/dL (ref 70–99)
Potassium: 4.4 mmol/L (ref 3.5–5.1)
Sodium: 139 mmol/L (ref 135–145)

## 2021-08-22 MED ORDER — OXYMETAZOLINE HCL 0.05 % NA SOLN: 1.0000 | Freq: Once | NASAL | Status: DC

## 2021-08-22 NOTE — ED Provider Triage Note (Signed)
Emergency Medicine Provider Triage Evaluation Note ? ?Tamara Carroll , a 26 y.o. female  was evaluated in triage.   ? ?Patient with medical history notable for anxiety, dysmenorrhea/endometriosis, hypothyroid presents today due to epistaxis.  Yesterday patient had a nosebleed that lasted roughly an hour, she passed clots.  States she had a syncopal episode due to blood loss.  Was unconscious for less than a minute, it was witnessed and she did not hit her head.  Today while she was aware she had another episode of epistaxis that ended with pressure, denies any syncope or presyncope.  Came to ED due to concern about syncopal event yesterday in the context of blood loss. ?  ?Patient denies any chest pain shortness of breath, denies any chance she could be pregnant.  Of note she recently started a medicine hypothyroid 1 week ago.  No recent changes in medication.  Not on any anticoagulation.  No history of PEs.  She denies any chest pain or shortness of breath.. ? ?Review of Systems  ?Per HPI ? ?Physical Exam  ?BP 125/66   Pulse 72   Temp 97.9 ?F (36.6 ?C) (Oral)   Resp 18   Ht 4\' 11"  (1.499 m)   Wt 86.2 kg   LMP 07/25/2021   SpO2 100%   BMI 38.38 kg/m?  ?Gen:   Awake, no distress   ?Resp:  Normal effort  ?MSK:   Moves extremities without difficulty  ?Other:   ? ?Medical Decision Making  ?Medically screening exam initiated at 6:29 PM.  Appropriate orders placed.  Tamara Carroll was informed that the remainder of the evaluation will be completed by another provider, this initial triage assessment does not replace that evaluation, and the importance of remaining in the ED until their evaluation is complete. ? ?Labs ?  ?Tamara Nations, PA-C ?08/22/21 1830 ? ?

## 2021-08-22 NOTE — ED Triage Notes (Signed)
Pt states she had nose bleeds while at work last night with large blood clots; pt states she had a syncopal episode last night as well with the nose bleed; pt had a headache with the nosebleed last night ?

## 2021-08-22 NOTE — Discharge Instructions (Addendum)
You will need to have the work-up done by the hematologist, this is the blood doctor, they will look into why you are having difficulty with easy bleeding but rest assured that you are not anemic and you have totally normal levels of blood.  If you have recurrent nosebleeds that do not stop please return to the emergency department immediately. ?

## 2021-08-22 NOTE — ED Provider Notes (Signed)
?[facility] EMERGENCY DEPARTMENT ?Provider Note ? ? ?CSN: [id number] ?Arrival date & time: 08/22/21  1652 ? ?  ? ?History ? ?Chief Complaint  ?Patient presents with  ? Epistaxis  ? ? ?HARNOOR RETA is a 26 y.o. female. ? ? ?Epistaxis ? ?This patient is a 26 year old female, she presents to the hospital today with a complaint of a nosebleed that happened yesterday, it happened again today, they stopped spontaneously but then she blows a lot of blood out of her nose.  The bleeding is now stopped spontaneously, she states that her gums always bleed when she brushes her teeth, she does not have critically heavy periods, she was told by her dentist that she was just brushing too hard in the past. ? ?The patient has had recent lab work done because of needing surgery for endometriosis but was told that her labs looked unremarkable and she was not anemic.  She syncopized yesterday when bleeding, she did not again today. ? ?Home Medications ?Prior to Admission medications   ?Medication Sig Start Date End Date Taking? Authorizing Provider  ?albuterol (VENTOLIN HFA) 108 (90 Base) MCG/ACT inhaler Inhale 1 puff into the lungs as needed. 10/01/15   [provider]  ?azithromycin (ZITHROMAX) 250 MG tablet Take 1 tablet (250 mg total) by mouth daily. Take first 2 tablets together, then 1 every day until finished. 12/10/20   Henderly, Britni A, PA-C  ?benzonatate (TESSALON) 100 MG capsule Take 1 capsule (100 mg total) by mouth every 8 (eight) hours. 12/10/20   Henderly, Britni A, PA-C  ?diclofenac (VOLTAREN) 75 MG EC tablet Take 1 tablet (75 mg total) by mouth 2 (two) times daily. Take with food 05/20/20   Triplett, Tammy, PA-C  ?EPINEPHrine (EPIPEN 2-PAK) 0.3 mg/0.3 mL IJ SOAJ injection Inject 0.3 mg into the muscle as needed for anaphylaxis.    [provider]  ?levonorgestrel (MIRENA, 52 MG,) 20 MCG/24HR IUD 1 each by Intrauterine route once.    [provider]  ?sertraline (ZOLOFT) 100 MG tablet Take 2  tablets by mouth daily. 05/19/18   [provider]  ?   ? ?Allergies    ?Peanut oil, Penicillins, Latex, and Other   ? ?Review of Systems   ?Review of Systems  ?HENT:  Positive for nosebleeds.   ?All other systems reviewed and are negative. ? ?Physical Exam ?Updated Vital Signs ?BP (!) 104/59   Pulse 71   Temp 97.9 ?F (36.6 ?C) (Oral)   Resp (!) 24   Ht 1.499 m (4\' 11" )   Wt 86.2 kg   LMP 07/25/2021   SpO2 97%   BMI 38.38 kg/m?  ?Physical Exam ?Vitals and nursing note reviewed.  ?Constitutional:   ?   General: She is not in acute distress. ?   Appearance: She is well-developed.  ?HENT:  ?   Head: Normocephalic and atraumatic.  ?   Nose:  ?   Comments: Totally normal nose exam, there is no areas of bleeding, there is no clot, she has very patent nostrils.  Back of the mouth is totally clear without blood, mucous membranes are normal. ?   Mouth/Throat:  ?   Pharynx: No oropharyngeal exudate.  ?Eyes:  ?   General: No scleral icterus.    ?   Right eye: No discharge.     ?   Left eye: No discharge.  ?   Conjunctiva/sclera: Conjunctivae normal.  ?   Pupils: Pupils are equal, round, and reactive to light.  ?Neck:  ?  Thyroid: No thyromegaly.  ?   Vascular: No JVD.  ?Cardiovascular:  ?   Rate and Rhythm: Normal rate and regular rhythm.  ?   Heart sounds: Normal heart sounds. No murmur heard. ?  No friction rub. No gallop.  ?Pulmonary:  ?   Effort: Pulmonary effort is normal. No respiratory distress.  ?   Breath sounds: Normal breath sounds. No wheezing or rales.  ?Abdominal:  ?   General: Bowel sounds are normal. There is no distension.  ?   Palpations: Abdomen is soft. There is no mass.  ?   Tenderness: There is no abdominal tenderness.  ?Musculoskeletal:     ?   General: No tenderness. Normal range of motion.  ?   Cervical back: Normal range of motion and neck supple.  ?Lymphadenopathy:  ?   Cervical: No cervical adenopathy.  ?Skin: ?   General: Skin is warm and dry.  ?   Findings: No erythema or rash.   ?Neurological:  ?   Mental Status: She is alert.  ?   Coordination: Coordination normal.  ?Psychiatric:     ?   Behavior: Behavior normal.  ? ? ?ED Results / Procedures / Treatments   ?Labs ?(all labs ordered are listed, but only abnormal results are displayed) ?Labs Reviewed  ?CBC WITH DIFFERENTIAL/PLATELET  ?BASIC METABOLIC PANEL  ? ? ?EKG ?None ? ?Radiology ?No results found. ? ?Procedures ?Procedures  ? ? ?Medications Ordered in ED ?Medications - No data to display ? ?ED Course/ Medical Decision Making/ A&P ?  ?                        ?Medical Decision Making ? ?This patient's exam is unremarkable, she is not anemic, her vital signs are also unremarkable, she can follow-up with the outpatient hematology office for further testing prior to undergoing surgery.  Patient stable for discharge at this time, she understands the indications for return and is agreeable to the plan, ambulatory referral to hematology given ? ? ? ? ? ? ? ?Final Clinical Impression(s) / ED Diagnoses ?Final diagnoses:  ?Epistaxis  ? ? ?Rx / DC Orders ?ED Discharge Orders   ? ?      Ordered  ?  Ambulatory referral to Hematology / Oncology       ? 08/22/21 1936  ? ?  ?  ? ?  ? ? ?  ?Eber Hong, MD ?08/22/21 1937 ? ?

## 2021-09-06 DIAGNOSIS — M6289 Other specified disorders of muscle: Secondary | ICD-10-CM | POA: Diagnosis not present

## 2021-09-06 DIAGNOSIS — N941 Unspecified dyspareunia: Secondary | ICD-10-CM | POA: Diagnosis not present

## 2021-09-06 DIAGNOSIS — N946 Dysmenorrhea, unspecified: Secondary | ICD-10-CM | POA: Diagnosis not present

## 2021-09-06 DIAGNOSIS — N920 Excessive and frequent menstruation with regular cycle: Secondary | ICD-10-CM | POA: Diagnosis not present

## 2021-09-09 DIAGNOSIS — M79672 Pain in left foot: Secondary | ICD-10-CM | POA: Diagnosis not present

## 2021-09-09 DIAGNOSIS — G5750 Tarsal tunnel syndrome, unspecified lower limb: Secondary | ICD-10-CM | POA: Diagnosis not present

## 2021-09-09 DIAGNOSIS — M25579 Pain in unspecified ankle and joints of unspecified foot: Secondary | ICD-10-CM | POA: Diagnosis not present

## 2021-09-17 DIAGNOSIS — R102 Pelvic and perineal pain: Secondary | ICD-10-CM | POA: Diagnosis not present

## 2021-09-17 DIAGNOSIS — N941 Unspecified dyspareunia: Secondary | ICD-10-CM | POA: Diagnosis not present

## 2021-09-17 DIAGNOSIS — M6289 Other specified disorders of muscle: Secondary | ICD-10-CM | POA: Diagnosis not present

## 2021-10-14 DIAGNOSIS — M79672 Pain in left foot: Secondary | ICD-10-CM | POA: Diagnosis not present

## 2021-10-14 DIAGNOSIS — M79671 Pain in right foot: Secondary | ICD-10-CM | POA: Diagnosis not present

## 2021-10-14 DIAGNOSIS — G5750 Tarsal tunnel syndrome, unspecified lower limb: Secondary | ICD-10-CM | POA: Diagnosis not present

## 2021-10-14 DIAGNOSIS — M25579 Pain in unspecified ankle and joints of unspecified foot: Secondary | ICD-10-CM | POA: Diagnosis not present

## 2021-10-26 ENCOUNTER — Encounter (HOSPITAL_COMMUNITY): Payer: Self-pay

## 2021-10-26 ENCOUNTER — Other Ambulatory Visit: Payer: Self-pay

## 2021-10-26 ENCOUNTER — Emergency Department (HOSPITAL_COMMUNITY)
Admission: EM | Admit: 2021-10-26 | Discharge: 2021-10-26 | Disposition: A | Discharge status: Home / Self Care | Payer: BC Managed Care – PPO | Attending: Emergency Medicine | Admitting: Emergency Medicine

## 2021-10-26 DIAGNOSIS — B029 Zoster without complications: Secondary | ICD-10-CM | POA: Insufficient documentation

## 2021-10-26 DIAGNOSIS — Z91040 Latex allergy status: Secondary | ICD-10-CM | POA: Insufficient documentation

## 2021-10-26 DIAGNOSIS — R21 Rash and other nonspecific skin eruption: Secondary | ICD-10-CM | POA: Diagnosis not present

## 2021-10-26 MED ORDER — VALACYCLOVIR HCL 1 G PO TABS: 1000.0000 mg | ORAL_TABLET | Freq: Three times a day (TID) | ORAL | 0 refills | Status: DC

## 2021-10-26 NOTE — ED Triage Notes (Signed)
Pt with painful linear rash on the L side of her neck. Pt with previous hx of shingles.

## 2021-10-26 NOTE — Discharge Instructions (Addendum)
Take valacyclovir 3 times daily.  Do this for 7 days.  Can take Tylenol and Motrin for pain.

## 2021-10-26 NOTE — ED Provider Notes (Signed)
[facility] EMERGENCY DEPARTMENT Provider Note   CSN: [id number] Arrival date & time: 10/26/21  2201     History  Chief Complaint  Patient presents with   Rash    Tamara Carroll is a 26 y.o. female.   Rash   Patient presents due to rash on left neck.  This started initially as a ache to the side of her neck, 3 days ago she started having vesicular lesions which brought her in the linear fashion.  She had shingles before, states this feels very similar.  Denies any vision changes, new contact exposure, fevers.  Home Medications Prior to Admission medications   Medication Sig Start Date End Date Taking? Authorizing Provider  valACYclovir (VALTREX) 1000 MG tablet Take 1 tablet (1,000 mg total) by mouth 3 (three) times daily. 10/26/21  Yes Theron Arista, PA-C  albuterol (VENTOLIN HFA) 108 (90 Base) MCG/ACT inhaler Inhale 1 puff into the lungs as needed. 10/01/15   [provider]  azithromycin (ZITHROMAX) 250 MG tablet Take 1 tablet (250 mg total) by mouth daily. Take first 2 tablets together, then 1 every day until finished. 12/10/20   Henderly, Britni A, PA-C  benzonatate (TESSALON) 100 MG capsule Take 1 capsule (100 mg total) by mouth every 8 (eight) hours. 12/10/20   Henderly, Britni A, PA-C  diclofenac (VOLTAREN) 75 MG EC tablet Take 1 tablet (75 mg total) by mouth 2 (two) times daily. Take with food 05/20/20   Triplett, Tammy, PA-C  EPINEPHrine (EPIPEN 2-PAK) 0.3 mg/0.3 mL IJ SOAJ injection Inject 0.3 mg into the muscle as needed for anaphylaxis.    [provider]  levonorgestrel (MIRENA, 52 MG,) 20 MCG/24HR IUD 1 each by Intrauterine route once.    [provider]  sertraline (ZOLOFT) 100 MG tablet Take 2 tablets by mouth daily. 05/19/18   [provider]      Allergies    Peanut oil, Penicillins, Latex, and Other    Review of Systems   Review of Systems  Skin:  Positive for rash.    Physical Exam Updated Vital Signs BP 121/79 (BP  Location: Right Arm)   Pulse 73   Temp 98.5 F (36.9 C) (Oral)   Resp 20   Ht 4\' 11"  (1.499 m)   Wt 90.7 kg   LMP 10/24/2021   SpO2 100%   BMI 40.40 kg/m  Physical Exam Vitals and nursing note reviewed. Exam conducted with a chaperone present.  Constitutional:      General: She is not in acute distress.    Appearance: Normal appearance.  HENT:     Head: Normocephalic and atraumatic.  Eyes:     General: No scleral icterus.    Extraocular Movements: Extraocular movements intact.     Pupils: Pupils are equal, round, and reactive to light.  Skin:    Coloration: Skin is not jaundiced.     Findings: Rash present.     Comments: Vesicular rash to left neck following a dermatomal pattern, tender to touch  Neurological:     Mental Status: She is alert. Mental status is at baseline.     Coordination: Coordination normal.     ED Results / Procedures / Treatments   Labs (all labs ordered are listed, but only abnormal results are displayed) Labs Reviewed - No data to display  EKG None  Radiology No results found.  Procedures Procedures    Medications Ordered in ED Medications - No data to display  ED Course/ Medical Decision Making/ A&P  Medical Decision Making Risk Prescription drug management.   Patient presents with rash to left side of her neck.  Follows dermatomal distribution, consistent with shingles.  Will discharge with antivirals.    Vital stable, not febrile.  No eye or immunocompromise status.        Final Clinical Impression(s) / ED Diagnoses Final diagnoses:  Herpes zoster without complication    Rx / DC Orders ED Discharge Orders          Ordered    valACYclovir (VALTREX) 1000 MG tablet  3 times daily        10/26/21 2218              Theron Arista, PA-C 10/26/21 2220    Terrilee Files, MD 10/27/21 1110

## 2021-11-07 DIAGNOSIS — M25579 Pain in unspecified ankle and joints of unspecified foot: Secondary | ICD-10-CM | POA: Diagnosis not present

## 2021-11-07 DIAGNOSIS — M79672 Pain in left foot: Secondary | ICD-10-CM | POA: Diagnosis not present

## 2021-11-07 DIAGNOSIS — M1990 Unspecified osteoarthritis, unspecified site: Secondary | ICD-10-CM | POA: Diagnosis not present

## 2021-11-11 DIAGNOSIS — M25579 Pain in unspecified ankle and joints of unspecified foot: Secondary | ICD-10-CM | POA: Diagnosis not present

## 2021-11-21 DIAGNOSIS — E038 Other specified hypothyroidism: Secondary | ICD-10-CM | POA: Diagnosis not present

## 2021-11-21 DIAGNOSIS — N946 Dysmenorrhea, unspecified: Secondary | ICD-10-CM | POA: Diagnosis not present

## 2021-11-21 DIAGNOSIS — R7989 Other specified abnormal findings of blood chemistry: Secondary | ICD-10-CM | POA: Diagnosis not present

## 2021-11-21 DIAGNOSIS — N920 Excessive and frequent menstruation with regular cycle: Secondary | ICD-10-CM | POA: Diagnosis not present

## 2021-11-26 DIAGNOSIS — S93412A Sprain of calcaneofibular ligament of left ankle, initial encounter: Secondary | ICD-10-CM | POA: Diagnosis not present

## 2021-11-26 DIAGNOSIS — M79672 Pain in left foot: Secondary | ICD-10-CM | POA: Diagnosis not present

## 2021-12-02 DIAGNOSIS — Z6841 Body Mass Index (BMI) 40.0 and over, adult: Secondary | ICD-10-CM | POA: Diagnosis not present

## 2021-12-02 DIAGNOSIS — R7989 Other specified abnormal findings of blood chemistry: Secondary | ICD-10-CM | POA: Diagnosis not present

## 2021-12-05 DIAGNOSIS — F32A Depression, unspecified: Secondary | ICD-10-CM | POA: Diagnosis not present

## 2021-12-05 DIAGNOSIS — E669 Obesity, unspecified: Secondary | ICD-10-CM | POA: Diagnosis not present

## 2021-12-05 DIAGNOSIS — R109 Unspecified abdominal pain: Secondary | ICD-10-CM | POA: Diagnosis not present

## 2021-12-05 DIAGNOSIS — E039 Hypothyroidism, unspecified: Secondary | ICD-10-CM | POA: Diagnosis not present

## 2021-12-16 DIAGNOSIS — E221 Hyperprolactinemia: Secondary | ICD-10-CM | POA: Diagnosis not present

## 2021-12-16 DIAGNOSIS — Z6841 Body Mass Index (BMI) 40.0 and over, adult: Secondary | ICD-10-CM | POA: Diagnosis not present

## 2021-12-16 DIAGNOSIS — K8020 Calculus of gallbladder without cholecystitis without obstruction: Secondary | ICD-10-CM | POA: Diagnosis not present

## 2021-12-16 DIAGNOSIS — R1084 Generalized abdominal pain: Secondary | ICD-10-CM | POA: Diagnosis not present

## 2021-12-16 DIAGNOSIS — R7989 Other specified abnormal findings of blood chemistry: Secondary | ICD-10-CM | POA: Diagnosis not present

## 2021-12-17 DIAGNOSIS — M79672 Pain in left foot: Secondary | ICD-10-CM | POA: Diagnosis not present

## 2021-12-17 DIAGNOSIS — S93412A Sprain of calcaneofibular ligament of left ankle, initial encounter: Secondary | ICD-10-CM | POA: Diagnosis not present

## 2022-01-06 ENCOUNTER — Other Ambulatory Visit: Payer: Self-pay | Admitting: *Deleted

## 2022-01-06 DIAGNOSIS — K8020 Calculus of gallbladder without cholecystitis without obstruction: Secondary | ICD-10-CM

## 2022-01-09 ENCOUNTER — Encounter: Payer: Self-pay | Admitting: General Surgery

## 2022-01-09 ENCOUNTER — Ambulatory Visit: Payer: BC Managed Care – PPO | Admitting: General Surgery

## 2022-01-09 VITALS — BP 131/79 | HR 65 | Temp 98.0°F | Resp 16 | Ht 59.0 in | Wt 203.0 lb

## 2022-01-09 DIAGNOSIS — K8020 Calculus of gallbladder without cholecystitis without obstruction: Secondary | ICD-10-CM

## 2022-01-09 NOTE — Patient Instructions (Signed)
Minimally Invasive Cholecystectomy Minimally invasive cholecystectomy is surgery to remove the gallbladder. The gallbladder is a pear-shaped organ that lies beneath the liver on the right side of the body. The gallbladder stores bile, which is a fluid that helps the body digest fats. Cholecystectomy is often done to treat inflammation (irritation and swelling) of the gallbladder (cholecystitis). This condition is usually caused by a buildup of gallstones (cholelithiasis) in the gallbladder or when the fluid in the gall bladder becomes stagnant because gallstones get stuck in the ducts (tubes) and block the flow of bile. This can result in inflammation and pain. In severe cases, emergency surgery may be required. This procedure is done through small incisions in the abdomen, instead of one large incision. It is also called laparoscopic surgery. A thin scope with a camera (laparoscope) is inserted through one incision. Then surgical instruments are inserted through the other incisions. In some cases, a minimally invasive surgery may need to be changed to a surgery that is done through a larger incision. This is called open surgery. Tell a health care provider about: Any allergies you have. All medicines you are taking, including vitamins, herbs, eye drops, creams, and over-the-counter medicines. Any problems you or family members have had with anesthetic medicines. Any bleeding problems you have. Any surgeries you have had. Any medical conditions you have. Whether you are pregnant or may be pregnant. What are the risks? Generally, this is a safe procedure. However, problems may occur, including: Infection. Bleeding. Allergic reactions to medicines. Damage to nearby structures or organs. A gallstone remaining in the common bile duct. The common bile duct carries bile from the gallbladder to the small intestine. A bile leak from the liver or cystic duct after your gallbladder is removed. What happens  before the procedure?  Medicines Ask your health care provider about: Changing or stopping your regular medicines. This is especially important if you are taking diabetes medicines or blood thinners. Taking medicines such as aspirin and ibuprofen. These medicines can thin your blood. Do not take these medicines unless your health care provider tells you to take them. Taking over-the-counter medicines, vitamins, herbs, and supplements. General instructions If you will be going home right after the procedure, plan to have a responsible adult: Take you home from the hospital or clinic. You will not be allowed to drive. Care for you for the time you are told. Do not use any products that contain nicotine or tobacco for at least 4 weeks before the procedure. These products include cigarettes, chewing tobacco, and vaping devices, such as e-cigarettes. If you need help quitting, ask your health care provider. Ask your health care provider: How your surgery site will be marked. What steps will be taken to help prevent infection. These may include: Removing hair at the surgery site. Washing skin with a germ-killing soap. Taking antibiotic medicine. What happens during the procedure?  An IV will be inserted into one of your veins. You will be given one or both of the following: A medicine to help you relax (sedative). A medicine to make you fall asleep (general anesthetic). Your surgeon will make several small incisions in your abdomen. The laparoscope will be inserted through one of the small incisions. The camera on the laparoscope will send images to a monitor in the operating room. This lets your surgeon see inside your abdomen. A gas will be pumped into your abdomen. This will expand your abdomen to give the surgeon more room to perform the surgery. Other tools   that are needed for the procedure will be inserted through the other incisions. The gallbladder will be removed through one of the  incisions. Your common bile duct may be examined. If stones are found in the common bile duct, they may be removed. After your gallbladder has been removed, the incisions will be closed with stitches (sutures), staples, or skin glue. Your incisions will be covered with a bandage (dressing). The procedure may vary among health care providers and hospitals. What happens after the procedure? Your blood pressure, heart rate, breathing rate, and blood oxygen level will be monitored until you leave the hospital or clinic. You will be given medicines as needed to control your pain. You may have a drain placed in the incision. The drain will be removed a day or two after the procedure. Summary Minimally invasive cholecystectomy, also called laparoscopic cholecystectomy, is surgery to remove the gallbladder using small incisions. Tell your health care provider about all the medical conditions you have and all the medicines you are taking for those conditions. Before the procedure, follow instructions about when to stop eating and drinking and changing or stopping medicines. Plan to have a responsible adult care for you for the time you are told after you leave the hospital or clinic. This information is not intended to replace advice given to you by your health care provider. Make sure you discuss any questions you have with your health care provider. Document Revised: 10/16/2020 Document Reviewed: 10/16/2020 Elsevier Patient Education  2023 [employer]   Cholelithiasis  Cholelithiasis is a disease in which gallstones form in the gallbladder. The gallbladder is an organ that stores bile. Bile is a fluid that helps to digest fats. Gallstones begin as small crystals and can slowly grow into stones. They may cause no symptoms until they block the gallbladder duct, or cystic duct, when the gallbladder tightens (contracts) after food is eaten. This can cause pain and is known as a gallbladder attack, or  biliary colic. There are two main types of gallstones: Cholesterol stones. These are the most common type of gallstone. These stones are made of hardened cholesterol and are usually yellow-green in color. Cholesterol is a fat-like substance that is made in the liver. Pigment stones. These are dark in color and are made of a red-yellow substance, called bilirubin,that forms when hemoglobin from red blood cells breaks down. What are the causes? This condition may be caused by an imbalance in the different parts that make bile. This can happen if the bile: Has too much bilirubin. This can happen in certain blood diseases, such as sickle cell anemia. Has too much cholesterol. Does not have enough bile salts. These salts help the body absorb and digest fats. In some cases, this condition can also be caused by the gallbladder not emptying completely or often enough. This is common during pregnancy. What increases the risk? The following factors may make you more likely to develop this condition: Being female. Having multiple pregnancies. Health care providers sometimes advise removing diseased gallbladders before future pregnancies. Eating a diet that is heavy in fried foods, fat, and refined carbohydrates, such as white bread and white rice. Being obese. Being older than age 40. Using medicines that contain female hormones (estrogen) for a long time. Losing weight quickly. Having a family history of gallstones. Having certain medical problems, such as: Diabetes mellitus. Cystic fibrosis. Crohn's disease. Cirrhosis or other long-term (chronic) liver disease. Certain blood diseases, such as sickle cell anemia or leukemia. What are the   signs or symptoms? In many cases, having gallstones causes no symptoms. When you have gallstones but do not have symptoms, you have silent gallstones. If a gallstone blocks your bile duct, it can cause a gallbladder attack. The main symptom of a gallbladder attack  is sudden pain in the upper right part of the abdomen. The pain: Usually comes at night or after eating. Can last for one hour or more. Can spread to your right shoulder, back, or chest. Can feel like indigestion. This is discomfort, burning, or fullness in your upper abdomen. If the bile duct is blocked for more than a few hours, it can cause an infection or inflammation of your gallbladder (cholecystitis), liver, or pancreas. This can cause: Nausea or vomiting. Bloating. Pain in your abdomen that lasts for 5 hours or longer. Tenderness in your upper abdomen, often in the upper right section and under your rib cage. Fever or chills. Skin or the white parts of your eyes turning yellow (jaundice). This usually happens when a stone has blocked bile from passing through the common bile duct. Dark urine or light-colored stools. How is this diagnosed? This condition may be diagnosed based on: A physical exam. Your medical history. Ultrasound. CT scan. MRI. You may also have other tests, including: Blood tests to check for signs of an infection or inflammation. Cholescintigraphy, or HIDA scan. This is a scan of your gallbladder and bile ducts (biliary system) using non-harmful radioactive material and special cameras that can see the radioactive material. Endoscopic retrograde cholangiopancreatogram. This involves inserting a small tube with a camera on the end (endoscope) through your mouth to look at bile ducts and check for blockages. How is this treated? Treatment for this condition depends on the severity of the condition. Silent gallstones do not need treatment. Treatment may be needed if a blockage causes a gallbladder attack or other symptoms. Treatment may include: Home care, if symptoms are not severe. During a simple gallbladder attack, stop eating and drinking for 12-24 hours (except for water and clear liquids). This helps to "cool down" your gallbladder. After 1 or 2 days, you can  start to eat a diet of simple or clear foods, such as broths and crackers. You may also need medicines for pain or nausea or both. If you have cholecystitis and an infection, you will need antibiotics. A hospital stay, if needed for pain control or for cholecystitis with severe infection. Cholecystectomy, or surgery to remove your gallbladder. This is the most common treatment if all other treatments have not worked. Medicines to break up gallstones. These are most effective at treating small gallstones. Medicines may be used for up to 6-12 months. Endoscopic retrograde cholangiopancreatogram. A small basket can be attached to the endoscope and used to capture and remove gallstones, mainly those that are in the common bile duct. Follow these instructions at home: Medicines Take over-the-counter and prescription medicines only as told by your health care provider. If you were prescribed an antibiotic medicine, take it as told by your health care provider. Do not stop taking the antibiotic even if you start to feel better. Ask your health care provider if the medicine prescribed to you requires you to avoid driving or using machinery. Eating and drinking Drink enough fluid to keep your urine pale yellow. This is important during a gallbladder attack. Water and clear liquids are preferred. Follow a healthy diet. This includes: Reducing fatty foods, such as fried food and foods high in cholesterol. Reducing refined carbohydrates, such as   white bread and white rice. Eating more fiber. Aim for foods such as almonds, fruit, and beans. Alcohol use If you drink alcohol: Limit how much you use to: 0-1 drink a day for nonpregnant women. 0-2 drinks a day for men. Be aware of how much alcohol is in your drink. In the [place] one drink equals one 12 oz bottle of beer (355 mL), one 5 oz glass of wine (148 mL), or one 1 oz glass of hard liquor (44 mL). General instructions Do not use any products that  contain nicotine or tobacco, such as cigarettes, e-cigarettes, and chewing tobacco. If you need help quitting, ask your health care provider. Maintain a healthy weight. Keep all follow-up visits as told by your health care provider. These may include consultations with a surgeon or specialist. This is important. Where to find more information National Institute of Diabetes and Digestive and Kidney Diseases: www.niddk.nih.gov Contact a health care provider if: You think you have had a gallbladder attack. You have been diagnosed with silent gallstones and you develop pain in your abdomen or indigestion. You begin to have attacks more often. You have dark urine or light-colored stools. Get help right away if: You have pain from a gallbladder attack that lasts for more than 2 hours. You have pain in your abdomen that lasts for more than 5 hours or is getting worse. You have a fever or chills. You have nausea and vomiting that do not go away. You develop jaundice. Summary Cholelithiasis is a disease in which gallstones form in the gallbladder. This condition may be caused by an imbalance in the different parts that make bile. This can happen if your bile has too much bilirubin or cholesterol, or does not have enough bile salts. Treatment for gallstones depends on the severity of the condition. Silent gallstones do not need treatment. If gallstones cause a gallbladder attack or other symptoms, treatment usually involves not eating or drinking anything. Treatment may also include pain medicines and antibiotics, and it sometimes includes a hospital stay. Surgery to remove the gallbladder is common if all other treatments have not worked. This information is not intended to replace advice given to you by your health care provider. Make sure you discuss any questions you have with your health care provider. Document Revised: 03/07/2019 Document Reviewed: 03/07/2019 Elsevier Patient Education  2023  [employer]  

## 2022-01-09 NOTE — Progress Notes (Unsigned)
[facility] History and Physical  Reason for Referral:*** Referring Physician: ***  Chief Complaint   New Patient (Initial Visit)     Tamara Carroll is a 26 y.o. female.  HPI: ***.  The *** started *** and has had a duration of ***.  It is associated with ***.  The *** is improved with ***, and is made worse with ***.    Quality*** Context***  Past Medical History:  Diagnosis Date   Amenorrhea    Anxiety    Asthma    COVID-19 2021   Dysmenorrhea    Functional ovarian cysts    Heart murmur    functional heart murmur--advised not to give blood    Past Surgical History:  Procedure Laterality Date   APPENDECTOMY  2016   TONSILLECTOMY AND ADENOIDECTOMY  2001    Family History  Problem Relation Age of Onset   Thyroid disease Mother        hypothyroid   Diabetes Father    Diabetes Maternal Grandmother    Cancer Maternal Grandfather 75       colon ca   Ovarian cancer Paternal Grandmother 1       dec ovarian ca   Diabetes Paternal Grandfather    Heart attack Paternal Grandfather    Breast cancer Other     Social History   Tobacco Use   Smoking status: Never   Smokeless tobacco: Never  Vaping Use   Vaping Use: Never used  Substance Use Topics   Alcohol use: Never   Drug use: Never    Medications: {medication reviewed/display:3041432} Allergies as of 01/09/2022       Reactions   Peanut Oil Anaphylaxis   Penicillins Hives   Latex Hives   Other Other (See Comments)   Tree Nuts--throat closes up        Medication List        Accurate as of January 09, 2022 11:54 AM. If you have any questions, ask your nurse or doctor.          STOP taking these medications    azithromycin 250 MG tablet Commonly known as: ZITHROMAX Stopped by: Lucretia Roers, MD   benzonatate 100 MG capsule Commonly known as: TESSALON Stopped by: Lucretia Roers, MD   diclofenac 75 MG EC tablet Commonly known as: VOLTAREN Stopped by:  Lucretia Roers, MD   EpiPen 2-Pak 0.3 mg/0.3 mL Soaj injection Generic drug: EPINEPHrine Stopped by: Lucretia Roers, MD   Mirena (52 MG) 20 MCG/DAY Iud Generic drug: levonorgestrel Stopped by: Lucretia Roers, MD   valACYclovir 1000 MG tablet Commonly known as: VALTREX Stopped by: Lucretia Roers, MD       TAKE these medications    albuterol 108 (90 Base) MCG/ACT inhaler Commonly known as: VENTOLIN HFA Inhale 1 puff into the lungs as needed.   cephALEXin 500 MG capsule Commonly known as: KEFLEX Take 500 mg by mouth 4 (four) times daily.   diazepam 5 MG tablet Commonly known as: VALIUM Insert 1 tablet (5 mg total) into the vagina nightly as needed for muscle spasms (30-60 minutes prior to ancipated intercourse.)   Fish Oil 1000 MG Cpdr Take by mouth. 2000 mg qd   levothyroxine 25 MCG tablet Commonly known as: SYNTHROID Take by mouth.   sertraline 100 MG tablet Commonly known as: ZOLOFT Take 2 tablets by mouth daily.         ROS:  {Review of Systems:30496}  Blood pressure 131/79, pulse  65, temperature 98 F (36.7 C), temperature source Oral, resp. rate 16, height 4\' 11"  (1.499 m), weight 203 lb (92.1 kg), SpO2 98 %. Physical Exam  Results: CLINICAL DATA:  Abdomen pain   EXAM:  ABDOMEN ULTRASOUND COMPLETE   COMPARISON:  03/01/2016   FINDINGS:  Gallbladder: Multiple gallstones. Normal wall thickness. Negative  sonographic Murphy.   Common bile duct: Diameter: 2.5 mm   Liver: No focal lesion identified. Within normal limits in  parenchymal echogenicity. Portal vein is patent on color Doppler  imaging with normal direction of blood flow towards the liver.   IVC: No abnormality visualized.   Pancreas: Visualized portion unremarkable.   Spleen: Size and appearance within normal limits.   Right Kidney: Length: 9.4 cm. Echogenicity within normal limits. No  mass or hydronephrosis visualized.   Left Kidney: Length: 9.7 cm. Echogenicity  within normal limits. No  mass or hydronephrosis visualized.   Abdominal aorta: No aneurysm visualized.   Other findings: None.   IMPRESSION:  1. Cholelithiasis without sonographic evidence for acute  cholecystitis.  2. Otherwise negative abdominal ultrasound    Electronically Signed    By: Jasmine Pang M.D.    On: 12/16/2021 16:08  Assessment & Plan:  Tamara Carroll is a 26 y.o. female with *** -*** -*** -Follow up ***  All questions were answered to the satisfaction of the patient and family***.  The risk and benefits of *** were discussed including but not limited to ***.  After careful consideration, Tamara Carroll has decided to ***.    Lucretia Roers 01/09/2022, 11:54 AM

## 2022-01-10 NOTE — H&P (Signed)
[facility] History and Physical  Reason for Referral: Gallstones  Referring Physician: Toma Deiters, MD   Chief Complaint   New Patient (Initial Visit)     Tamara Carroll is a 26 y.o. female.  HPI: Tamara Carroll is a 26 yo who has been having RUQ pain and associated nausea and vomiting. She knows she has gallstones for a while. She has been having pain daily now and is unable to really eat anything without having nausea. She says every woman in her family has had her gallbladder removed. She recently had an MRI of her brain to rule out pituitary tumor due to elevated prolactin. The theory now is that prolactin elevation is due to her Sertraline.  Past Medical History:  Diagnosis Date   Amenorrhea    Anxiety    Asthma    COVID-19 2021   Dysmenorrhea    Functional ovarian cysts    Heart murmur    functional heart murmur--advised not to give blood    Past Surgical History:  Procedure Laterality Date   APPENDECTOMY  2016   TONSILLECTOMY AND ADENOIDECTOMY  2001    Family History  Problem Relation Age of Onset   Thyroid disease Mother        hypothyroid   Diabetes Father    Diabetes Maternal Grandmother    Cancer Maternal Grandfather 74       colon ca   Ovarian cancer Paternal Grandmother 88       dec ovarian ca   Diabetes Paternal Grandfather    Heart attack Paternal Grandfather    Breast cancer Other     Social History   Tobacco Use   Smoking status: Never   Smokeless tobacco: Never  Vaping Use   Vaping Use: Never used  Substance Use Topics   Alcohol use: Never   Drug use: Never    Medications: I have reviewed the patient's current medications. Allergies as of 01/09/2022       Reactions   Peanut Oil Anaphylaxis   Penicillins Hives   Latex Hives   Other Other (See Comments)   Tree Nuts--throat closes up        Medication List        Accurate as of January 09, 2022 11:54 AM. If you have any questions, ask your nurse or  doctor.          STOP taking these medications    azithromycin 250 MG tablet Commonly known as: ZITHROMAX Stopped by: Lucretia Roers, MD   benzonatate 100 MG capsule Commonly known as: TESSALON Stopped by: Lucretia Roers, MD   diclofenac 75 MG EC tablet Commonly known as: VOLTAREN Stopped by: Lucretia Roers, MD   EpiPen 2-Pak 0.3 mg/0.3 mL Soaj injection Generic drug: EPINEPHrine Stopped by: Lucretia Roers, MD   Mirena (52 MG) 20 MCG/DAY Iud Generic drug: levonorgestrel Stopped by: Lucretia Roers, MD   valACYclovir 1000 MG tablet Commonly known as: VALTREX Stopped by: Lucretia Roers, MD       TAKE these medications    albuterol 108 (90 Base) MCG/ACT inhaler Commonly known as: VENTOLIN HFA Inhale 1 puff into the lungs as needed.   cephALEXin 500 MG capsule Commonly known as: KEFLEX Take 500 mg by mouth 4 (four) times daily.   diazepam 5 MG tablet Commonly known as: VALIUM Insert 1 tablet (5 mg total) into the vagina nightly as needed for muscle spasms (30-60 minutes prior to ancipated intercourse.)   Fish  Oil 1000 MG Cpdr Take by mouth. 2000 mg qd   levothyroxine 25 MCG tablet Commonly known as: SYNTHROID Take by mouth.   sertraline 100 MG tablet Commonly known as: ZOLOFT Take 2 tablets by mouth daily.         ROS:  A comprehensive review of systems was negative except for: Respiratory: positive for asthma and wheezing Gastrointestinal: positive for abdominal pain, nausea, reflux symptoms, and vomiting Musculoskeletal: positive for back pain and joint pain  Blood pressure 131/79, pulse 65, temperature 98 F (36.7 C), temperature source Oral, resp. rate 16, height 4\' 11"  (1.499 m), weight 203 lb (92.1 kg), SpO2 98 %. Physical Exam  Results: CLINICAL DATA:  Abdomen pain   EXAM:  ABDOMEN ULTRASOUND COMPLETE   COMPARISON:  03/01/2016   FINDINGS:  Gallbladder: Multiple gallstones. Normal wall thickness. Negative   sonographic Murphy.   Common bile duct: Diameter: 2.5 mm   Liver: No focal lesion identified. Within normal limits in  parenchymal echogenicity. Portal vein is patent on color Doppler  imaging with normal direction of blood flow towards the liver.   IVC: No abnormality visualized.   Pancreas: Visualized portion unremarkable.   Spleen: Size and appearance within normal limits.   Right Kidney: Length: 9.4 cm. Echogenicity within normal limits. No  mass or hydronephrosis visualized.   Left Kidney: Length: 9.7 cm. Echogenicity within normal limits. No  mass or hydronephrosis visualized.   Abdominal aorta: No aneurysm visualized.   Other findings: None.   IMPRESSION:  1. Cholelithiasis without sonographic evidence for acute  cholecystitis.  2. Otherwise negative abdominal ultrasound    Electronically Signed    By: 13/07/2015 M.D.    On: 12/16/2021 16:08  Assessment & Plan:  Tamara Carroll is a 25 y.o. female with gallstones. She is very symptomatic and this could have progressed to chronic cholecystitis at this point.   PLAN: I counseled the patient about the indication, risks and benefits of laparoscopic cholecystectomy.  She understands there is a very small chance for bleeding, infection, injury to normal structures (including common bile duct), conversion to open surgery, persistent symptoms, evolution of postcholecystectomy diarrhea, need for secondary interventions, anesthesia reaction, cardiopulmonary issues and other risks not specifically detailed here. I described the expected recovery, the plan for follow-up and the restrictions during the recovery phase.  All questions were answered.   All questions were answered to the satisfaction of the patient and family.   26 01/09/2022, 11:54 AM

## 2022-01-13 NOTE — Patient Instructions (Signed)
Tamara Carroll  01/13/2022     @PREFPERIOPPHARMACY @   Your procedure is scheduled on  01/16/2022.   Report to 01/18/2022 at  0700 A.M.   Call this number if you have problems the morning of surgery:  [phone]   Remember:  Do not eat or drink after midnight.        Use your inhaler before you come and bring your rescue inhaler with you.     Take these medicines the morning of surgery with A SIP OF WATER                                             synthroid, zoloft.     Do not wear jewelry, make-up or nail polish.  Do not wear lotions, powders, or perfumes, or deodorant.  Do not shave 48 hours prior to surgery.  Men may shave face and neck.  Do not bring valuables to the hospital.  [facility] is not responsible for any belongings or valuables.  Contacts, dentures or bridgework may not be worn into surgery.  Leave your suitcase in the car.  After surgery it may be brought to your room.  For patients admitted to the hospital, discharge time will be determined by your treatment team.  Patients discharged the day of surgery will not be allowed to drive home and must have someone with them for 24 hours.     Special instructions:   DO NOT smoke tobacco or vape for 24 hours before your procedure.   Please read over the following fact sheets that you were given. Coughing and Deep Breathing, Surgical Site Infection Prevention, Anesthesia Post-op Instructions, and Care and Recovery After Surgery      Minimally Invasive Cholecystectomy, Care After The following information offers guidance on how to care for yourself after your procedure. Your health care provider may also give you more specific instructions. If you have problems or questions, contact your health care provider. What can I expect after the procedure? After the procedure, it is common to have: Pain at your incision sites. You will be given medicines to control this pain. Mild nausea or  vomiting. Bloating and possible shoulder pain from the gas that was used during the procedure. Follow these instructions at home: Medicines Take over-the-counter and prescription medicines only as told by your health care provider. If you were prescribed an antibiotic medicine, take it as told by your health care provider. Do not stop using the antibiotic even if you start to feel better. Ask your health care provider if the medicine prescribed to you: Requires you to avoid driving or using machinery. Can cause constipation. You may need to take these actions to prevent or treat constipation: Drink enough fluid to keep your urine pale yellow. Take over-the-counter or prescription medicines. Eat foods that are high in fiber, such as beans, whole grains, and fresh fruits and vegetables. Limit foods that are high in fat and processed sugars, such as fried or sweet foods. Incision care  Follow instructions from your health care provider about how to take care of your incisions. Make sure you: Wash your hands with soap and water for at least 20 seconds before and after you change your bandage (dressing). If soap and water are not available, use hand sanitizer. Change your dressing as told by your  health care provider. Leave stitches (sutures), skin glue, or adhesive strips in place. These skin closures may need to be in place for 2 weeks or longer. If adhesive strip edges start to loosen and curl up, you may trim the loose edges. Do not remove adhesive strips completely unless your health care provider tells you to do that. Do not take baths, swim, or use a hot tub until your health care provider approves. Ask your health care provider if you may take showers. You may only be allowed to take sponge baths. Check your incision area every day for signs of infection. Check for: More redness, swelling, or pain. Fluid or blood. Warmth. Pus or a bad smell. Activity Rest as told by your health care  provider. Do not do activities that require a lot of effort. Avoid sitting for a long time without moving. Get up to take short walks every 1-2 hours. This is important to improve blood flow and breathing. Ask for help if you feel weak or unsteady. Do not lift anything that is heavier than 10 lb (4.5 kg), or the limit that you are told, until your health care provider says that it is safe. Do not play contact sports until your health care provider approves. Do not return to work or school until your health care provider approves. Return to your normal activities as told by your health care provider. Ask your health care provider what activities are safe for you. General instructions If you were given a sedative during the procedure, it can affect you for several hours. Do not drive or operate machinery until your health care provider says that it is safe. Keep all follow-up visits. This is important. Contact a health care provider if: You develop a rash. You have more redness, swelling, or pain around your incisions. You have fluid or blood coming from your incisions. Your incisions feel warm to the touch. You have pus or a bad smell coming from your incisions. You have a fever. One or more of your incisions breaks open. Get help right away if: You have trouble breathing. You have chest pain. You have more pain in your shoulders. You faint or feel dizzy when you stand. You have severe pain in your abdomen. You have nausea or vomiting that lasts for more than one day. You have leg pain that is new or unusual, or if it is localized to one specific spot. These symptoms may represent a serious problem that is an emergency. Do not wait to see if the symptoms will go away. Get medical help right away. Call your local emergency services (911 in the [place] Do not drive yourself to the hospital. Summary After your procedure, it is common to have pain at the incision sites. You may also have nausea  or bloating. Follow your health care provider's instructions about medicine, activity restrictions, and caring for your incision areas. Do not do activities that require a lot of effort. Contact a health care provider if you have a fever or other signs of infection, such as more redness, swelling, or pain around the incisions. Get help right away if you have chest pain, increasing pain in the shoulders, or trouble breathing. This information is not intended to replace advice given to you by your health care provider. Make sure you discuss any questions you have with your health care provider. Document Revised: 10/16/2020 Document Reviewed: 10/16/2020 Elsevier Patient Education  [place] Anesthesia, Adult, Care After The following information offers  guidance on how to care for yourself after your procedure. Your health care provider may also give you more specific instructions. If you have problems or questions, contact your health care provider. What can I expect after the procedure? After the procedure, it is common for people to: Have pain or discomfort at the IV site. Have nausea or vomiting. Have a sore throat or hoarseness. Have trouble concentrating. Feel cold or chills. Feel weak, sleepy, or tired (fatigue). Have soreness and body aches. These can affect parts of the body that were not involved in surgery. Follow these instructions at home: For the time period you were told by your health care provider:  Rest. Do not participate in activities where you could fall or become injured. Do not drive or use machinery. Do not drink alcohol. Do not take sleeping pills or medicines that cause drowsiness. Do not make important decisions or sign legal documents. Do not take care of children on your own. General instructions Drink enough fluid to keep your urine pale yellow. If you have sleep apnea, surgery and certain medicines can increase your risk for breathing  problems. Follow instructions from your health care provider about wearing your sleep device: Anytime you are sleeping, including during daytime naps. While taking prescription pain medicines, sleeping medicines, or medicines that make you drowsy. Return to your normal activities as told by your health care provider. Ask your health care provider what activities are safe for you. Take over-the-counter and prescription medicines only as told by your health care provider. Do not use any products that contain nicotine or tobacco. These products include cigarettes, chewing tobacco, and vaping devices, such as e-cigarettes. These can delay incision healing after surgery. If you need help quitting, ask your health care provider. Contact a health care provider if: You have nausea or vomiting that does not get better with medicine. You vomit every time you eat or drink. You have pain that does not get better with medicine. You cannot urinate or have bloody urine. You develop a skin rash. You have a fever. Get help right away if: You have trouble breathing. You have chest pain. You vomit blood. These symptoms may be an emergency. Get help right away. Call 911. Do not wait to see if the symptoms will go away. Do not drive yourself to the hospital. Summary After the procedure, it is common to have a sore throat, hoarseness, nausea, vomiting, or to feel weak, sleepy, or fatigue. For the time period you were told by your health care provider, do not drive or use machinery. Get help right away if you have difficulty breathing, have chest pain, or vomit blood. These symptoms may be an emergency. This information is not intended to replace advice given to you by your health care provider. Make sure you discuss any questions you have with your health care provider. Document Revised: 07/12/2021 Document Reviewed: 07/12/2021 Elsevier Patient Education  2023 [employer] How to Use Chlorhexidine Before  Surgery Chlorhexidine gluconate (CHG) is a germ-killing (antiseptic) solution that is used to clean the skin. It can get rid of the bacteria that normally live on the skin and can keep them away for about 24 hours. To clean your skin with CHG, you may be given: A CHG solution to use in the shower or as part of a sponge bath. A prepackaged cloth that contains CHG. Cleaning your skin with CHG may help lower the risk for infection: While you are staying in the intensive care unit of  the hospital. If you have a vascular access, such as a central line, to provide short-term or long-term access to your veins. If you have a catheter to drain urine from your bladder. If you are on a ventilator. A ventilator is a machine that helps you breathe by moving air in and out of your lungs. After surgery. What are the risks? Risks of using CHG include: A skin reaction. Hearing loss, if CHG gets in your ears and you have a perforated eardrum. Eye injury, if CHG gets in your eyes and is not rinsed out. The CHG product catching fire. Make sure that you avoid smoking and flames after applying CHG to your skin. Do not use CHG: If you have a chlorhexidine allergy or have previously reacted to chlorhexidine. On babies younger than 66 months of age. How to use CHG solution Use CHG only as told by your health care provider, and follow the instructions on the label. Use the full amount of CHG as directed. Usually, this is one bottle. During a shower Follow these steps when using CHG solution during a shower (unless your health care provider gives you different instructions): Start the shower. Use your normal soap and shampoo to wash your face and hair. Turn off the shower or move out of the shower stream. Pour the CHG onto a clean washcloth. Do not use any type of brush or rough-edged sponge. Starting at your neck, lather your body down to your toes. Make sure you follow these instructions: If you will be having  surgery, pay special attention to the part of your body where you will be having surgery. Scrub this area for at least 1 minute. Do not use CHG on your head or face. If the solution gets into your ears or eyes, rinse them well with water. Avoid your genital area. Avoid any areas of skin that have broken skin, cuts, or scrapes. Scrub your back and under your arms. Make sure to wash skin folds. Let the lather sit on your skin for 1-2 minutes or as long as told by your health care provider. Thoroughly rinse your entire body in the shower. Make sure that all body creases and crevices are rinsed well. Dry off with a clean towel. Do not put any substances on your body afterward--such as powder, lotion, or perfume--unless you are told to do so by your health care provider. Only use lotions that are recommended by the manufacturer. Put on clean clothes or pajamas. If it is the night before your surgery, sleep in clean sheets.  During a sponge bath Follow these steps when using CHG solution during a sponge bath (unless your health care provider gives you different instructions): Use your normal soap and shampoo to wash your face and hair. Pour the CHG onto a clean washcloth. Starting at your neck, lather your body down to your toes. Make sure you follow these instructions: If you will be having surgery, pay special attention to the part of your body where you will be having surgery. Scrub this area for at least 1 minute. Do not use CHG on your head or face. If the solution gets into your ears or eyes, rinse them well with water. Avoid your genital area. Avoid any areas of skin that have broken skin, cuts, or scrapes. Scrub your back and under your arms. Make sure to wash skin folds. Let the lather sit on your skin for 1-2 minutes or as long as told by your health care provider. Using  a different clean, wet washcloth, thoroughly rinse your entire body. Make sure that all body creases and crevices are  rinsed well. Dry off with a clean towel. Do not put any substances on your body afterward--such as powder, lotion, or perfume--unless you are told to do so by your health care provider. Only use lotions that are recommended by the manufacturer. Put on clean clothes or pajamas. If it is the night before your surgery, sleep in clean sheets. How to use CHG prepackaged cloths Only use CHG cloths as told by your health care provider, and follow the instructions on the label. Use the CHG cloth on clean, dry skin. Do not use the CHG cloth on your head or face unless your health care provider tells you to. When washing with the CHG cloth: Avoid your genital area. Avoid any areas of skin that have broken skin, cuts, or scrapes. Before surgery Follow these steps when using a CHG cloth to clean before surgery (unless your health care provider gives you different instructions): Using the CHG cloth, vigorously scrub the part of your body where you will be having surgery. Scrub using a back-and-forth motion for 3 minutes. The area on your body should be completely wet with CHG when you are done scrubbing. Do not rinse. Discard the cloth and let the area air-dry. Do not put any substances on the area afterward, such as powder, lotion, or perfume. Put on clean clothes or pajamas. If it is the night before your surgery, sleep in clean sheets.  For general bathing Follow these steps when using CHG cloths for general bathing (unless your health care provider gives you different instructions). Use a separate CHG cloth for each area of your body. Make sure you wash between any folds of skin and between your fingers and toes. Wash your body in the following order, switching to a new cloth after each step: The front of your neck, shoulders, and chest. Both of your arms, under your arms, and your hands. Your stomach and groin area, avoiding the genitals. Your right leg and foot. Your left leg and foot. The back of  your neck, your back, and your buttocks. Do not rinse. Discard the cloth and let the area air-dry. Do not put any substances on your body afterward--such as powder, lotion, or perfume--unless you are told to do so by your health care provider. Only use lotions that are recommended by the manufacturer. Put on clean clothes or pajamas. Contact a health care provider if: Your skin gets irritated after scrubbing. You have questions about using your solution or cloth. You swallow any chlorhexidine. Call your local poison control center [phone] [phone] in the [place] Get help right away if: Your eyes itch badly, or they become very red or swollen. Your skin itches badly and is red or swollen. Your hearing changes. You have trouble seeing. You have swelling or tingling in your mouth or throat. You have trouble breathing. These symptoms may represent a serious problem that is an emergency. Do not wait to see if the symptoms will go away. Get medical help right away. Call your local emergency services (911 in the [place] Do not drive yourself to the hospital. Summary Chlorhexidine gluconate (CHG) is a germ-killing (antiseptic) solution that is used to clean the skin. Cleaning your skin with CHG may help to lower your risk for infection. You may be given CHG to use for bathing. It may be in a bottle or in a prepackaged cloth to use on your  skin. Carefully follow your health care provider's instructions and the instructions on the product label. Do not use CHG if you have a chlorhexidine allergy. Contact your health care provider if your skin gets irritated after scrubbing. This information is not intended to replace advice given to you by your health care provider. Make sure you discuss any questions you have with your health care provider. Document Revised: 08/12/2021 Document Reviewed: 06/25/2020 Elsevier Patient Education  [place].

## 2022-01-14 ENCOUNTER — Encounter (HOSPITAL_COMMUNITY): Payer: Self-pay

## 2022-01-14 ENCOUNTER — Encounter (HOSPITAL_COMMUNITY)
Admission: RE | Admit: 2022-01-14 | Discharge: 2022-01-14 | Disposition: A | Discharge status: Home / Self Care | Payer: BC Managed Care – PPO | Source: Ambulatory Visit | Attending: General Surgery | Admitting: General Surgery

## 2022-01-14 DIAGNOSIS — E039 Hypothyroidism, unspecified: Secondary | ICD-10-CM | POA: Diagnosis not present

## 2022-01-14 DIAGNOSIS — K8010 Calculus of gallbladder with chronic cholecystitis without obstruction: Secondary | ICD-10-CM | POA: Diagnosis not present

## 2022-01-14 DIAGNOSIS — Z01818 Encounter for other preprocedural examination: Secondary | ICD-10-CM

## 2022-01-14 DIAGNOSIS — J45909 Unspecified asthma, uncomplicated: Secondary | ICD-10-CM | POA: Diagnosis not present

## 2022-01-14 DIAGNOSIS — F419 Anxiety disorder, unspecified: Secondary | ICD-10-CM | POA: Diagnosis not present

## 2022-01-14 DIAGNOSIS — Z79899 Other long term (current) drug therapy: Secondary | ICD-10-CM | POA: Diagnosis not present

## 2022-01-14 HISTORY — DX: Hypothyroidism, unspecified: E03.9

## 2022-01-14 HISTORY — DX: Other complications of anesthesia, initial encounter: T88.59XA

## 2022-01-14 LAB — POCT PREGNANCY, URINE: Preg Test, Ur: NEGATIVE

## 2022-01-16 ENCOUNTER — Ambulatory Visit (HOSPITAL_COMMUNITY): Payer: BC Managed Care – PPO | Admitting: Anesthesiology

## 2022-01-16 ENCOUNTER — Encounter (HOSPITAL_COMMUNITY)
Admission: RE | Disposition: A | Discharge status: Home / Self Care | Payer: Self-pay | Source: Home / Self Care | Attending: General Surgery

## 2022-01-16 ENCOUNTER — Other Ambulatory Visit: Payer: Self-pay

## 2022-01-16 ENCOUNTER — Ambulatory Visit (HOSPITAL_COMMUNITY)
Admission: RE | Admit: 2022-01-16 | Discharge: 2022-01-16 | Disposition: A | Discharge status: Home / Self Care | Payer: BC Managed Care – PPO | Attending: General Surgery | Admitting: General Surgery

## 2022-01-16 ENCOUNTER — Encounter (HOSPITAL_COMMUNITY): Payer: Self-pay | Admitting: General Surgery

## 2022-01-16 DIAGNOSIS — J45909 Unspecified asthma, uncomplicated: Secondary | ICD-10-CM | POA: Insufficient documentation

## 2022-01-16 DIAGNOSIS — E039 Hypothyroidism, unspecified: Secondary | ICD-10-CM | POA: Diagnosis not present

## 2022-01-16 DIAGNOSIS — K8010 Calculus of gallbladder with chronic cholecystitis without obstruction: Secondary | ICD-10-CM | POA: Diagnosis not present

## 2022-01-16 DIAGNOSIS — F419 Anxiety disorder, unspecified: Secondary | ICD-10-CM | POA: Diagnosis not present

## 2022-01-16 DIAGNOSIS — K8020 Calculus of gallbladder without cholecystitis without obstruction: Secondary | ICD-10-CM | POA: Diagnosis not present

## 2022-01-16 DIAGNOSIS — Z79899 Other long term (current) drug therapy: Secondary | ICD-10-CM | POA: Insufficient documentation

## 2022-01-16 HISTORY — PX: CHOLECYSTECTOMY: SHX55

## 2022-01-16 SURGERY — LAPAROSCOPIC CHOLECYSTECTOMY
Anesthesia: General | Site: Abdomen

## 2022-01-16 MED ORDER — LIDOCAINE HCL (PF) 2 % IJ SOLN
INTRAMUSCULAR | Status: AC
  Filled 2022-01-16: qty 5

## 2022-01-16 MED ORDER — KETOROLAC TROMETHAMINE 30 MG/ML IJ SOLN
INTRAMUSCULAR | Status: AC
  Filled 2022-01-16: qty 1

## 2022-01-16 MED ORDER — CHLORHEXIDINE GLUCONATE 0.12 % MT SOLN
OROMUCOSAL | Status: AC
  Filled 2022-01-16: qty 15

## 2022-01-16 MED ORDER — FENTANYL CITRATE (PF) 100 MCG/2ML IJ SOLN
INTRAMUSCULAR | Status: DC | PRN
  Administered 2022-01-16 (×3): 50 ug via INTRAVENOUS

## 2022-01-16 MED ORDER — OXYCODONE HCL 5 MG PO TABS: 5.0000 mg | ORAL_TABLET | Freq: Once | ORAL | Status: DC | PRN

## 2022-01-16 MED ORDER — DIPHENHYDRAMINE HCL 50 MG/ML IJ SOLN
25.0000 mg | Freq: Once | INTRAMUSCULAR | Status: AC
  Administered 2022-01-16: 25 mg via INTRAVENOUS

## 2022-01-16 MED ORDER — CIPROFLOXACIN IN D5W 400 MG/200ML IV SOLN
400.0000 mg | INTRAVENOUS | Status: AC
  Administered 2022-01-16: 400 mg via INTRAVENOUS

## 2022-01-16 MED ORDER — LACTATED RINGERS IV SOLN: INTRAVENOUS | Status: DC

## 2022-01-16 MED ORDER — PROPOFOL 10 MG/ML IV BOLUS
INTRAVENOUS | Status: AC
  Filled 2022-01-16: qty 20

## 2022-01-16 MED ORDER — CHLORHEXIDINE GLUCONATE CLOTH 2 % EX PADS: 6.0000 | MEDICATED_PAD | Freq: Once | CUTANEOUS | Status: DC

## 2022-01-16 MED ORDER — FENTANYL CITRATE PF 50 MCG/ML IJ SOSY
25.0000 ug | PREFILLED_SYRINGE | INTRAMUSCULAR | Status: DC | PRN
  Administered 2022-01-16: 50 ug via INTRAVENOUS
  Filled 2022-01-16: qty 1

## 2022-01-16 MED ORDER — OXYCODONE HCL 5 MG PO TABS: 5.0000 mg | ORAL_TABLET | ORAL | 0 refills | Status: AC | PRN

## 2022-01-16 MED ORDER — KETOROLAC TROMETHAMINE 30 MG/ML IJ SOLN
30.0000 mg | Freq: Once | INTRAMUSCULAR | Status: AC
  Administered 2022-01-16: 30 mg via INTRAVENOUS

## 2022-01-16 MED ORDER — MIDAZOLAM HCL 5 MG/5ML IJ SOLN
INTRAMUSCULAR | Status: DC | PRN
  Administered 2022-01-16: 2 mg via INTRAVENOUS

## 2022-01-16 MED ORDER — MIDAZOLAM HCL 2 MG/2ML IJ SOLN
INTRAMUSCULAR | Status: AC
  Filled 2022-01-16: qty 2

## 2022-01-16 MED ORDER — ROCURONIUM 10MG/ML (10ML) SYRINGE FOR MEDFUSION PUMP - OPTIME
INTRAVENOUS | Status: DC | PRN
  Administered 2022-01-16: 40 mg via INTRAVENOUS

## 2022-01-16 MED ORDER — GLYCOPYRROLATE 0.2 MG/ML IJ SOLN
INTRAMUSCULAR | Status: DC | PRN
  Administered 2022-01-16: .2 mg via INTRAVENOUS

## 2022-01-16 MED ORDER — LIDOCAINE HCL (CARDIAC) PF 50 MG/5ML IV SOSY
PREFILLED_SYRINGE | INTRAVENOUS | Status: DC | PRN
  Administered 2022-01-16: 60 mg via INTRAVENOUS

## 2022-01-16 MED ORDER — CIPROFLOXACIN IN D5W 400 MG/200ML IV SOLN
INTRAVENOUS | Status: AC
  Filled 2022-01-16: qty 200

## 2022-01-16 MED ORDER — OXYCODONE HCL 5 MG/5ML PO SOLN: 5.0000 mg | Freq: Once | ORAL | Status: DC | PRN

## 2022-01-16 MED ORDER — BUPIVACAINE LIPOSOME 1.3 % IJ SUSP
INTRAMUSCULAR | Status: DC | PRN
  Administered 2022-01-16: 20 mL

## 2022-01-16 MED ORDER — ONDANSETRON HCL 4 MG/2ML IJ SOLN: 4.0000 mg | Freq: Once | INTRAMUSCULAR | Status: DC | PRN

## 2022-01-16 MED ORDER — BUPIVACAINE LIPOSOME 1.3 % IJ SUSP
INTRAMUSCULAR | Status: AC
  Filled 2022-01-16: qty 20

## 2022-01-16 MED ORDER — DIPHENHYDRAMINE HCL 50 MG/ML IJ SOLN
INTRAMUSCULAR | Status: AC
  Filled 2022-01-16: qty 1

## 2022-01-16 MED ORDER — HEMOSTATIC AGENTS (NO CHARGE) OPTIME
TOPICAL | Status: DC | PRN
  Administered 2022-01-16: 1 via TOPICAL

## 2022-01-16 MED ORDER — CHLORHEXIDINE GLUCONATE 0.12 % MT SOLN
15.0000 mL | Freq: Once | OROMUCOSAL | Status: AC
  Administered 2022-01-16: 15 mL via OROMUCOSAL

## 2022-01-16 MED ORDER — SODIUM CHLORIDE 0.9 % IR SOLN
Status: DC | PRN
  Administered 2022-01-16: 1000 mL

## 2022-01-16 MED ORDER — ORAL CARE MOUTH RINSE: 15.0000 mL | Freq: Once | OROMUCOSAL | Status: AC

## 2022-01-16 MED ORDER — PROPOFOL 10 MG/ML IV BOLUS
INTRAVENOUS | Status: DC | PRN
  Administered 2022-01-16: 150 mg via INTRAVENOUS

## 2022-01-16 MED ORDER — ONDANSETRON HCL 4 MG PO TABS: 4.0000 mg | ORAL_TABLET | Freq: Three times a day (TID) | ORAL | 1 refills | Status: AC | PRN

## 2022-01-16 MED ORDER — FENTANYL CITRATE (PF) 250 MCG/5ML IJ SOLN
INTRAMUSCULAR | Status: AC
  Filled 2022-01-16: qty 5

## 2022-01-16 SURGICAL SUPPLY — 38 items
ADH SKN CLS LQ APL DERMABOND (GAUZE/BANDAGES/DRESSINGS) ×1
APL PRP STRL LF DISP 70% ISPRP (MISCELLANEOUS) ×1
APPLIER CLIP ROT 10 11.4 M/L (STAPLE) ×1
APR CLP MED LRG 11.4X10 (STAPLE) ×1
BLADE SURG 15 STRL LF DISP TIS (BLADE) ×1 IMPLANT
BLADE SURG 15 STRL SS (BLADE) ×1
CHLORAPREP W/TINT 26 (MISCELLANEOUS) ×1 IMPLANT
CLIP APPLIE ROT 10 11.4 M/L (STAPLE) ×1 IMPLANT
CLOTH BEACON ORANGE TIMEOUT ST (SAFETY) ×1 IMPLANT
COVER LIGHT HANDLE STERIS (MISCELLANEOUS) ×2 IMPLANT
DERMABOND ADVANCED .7 DNX6 (GAUZE/BANDAGES/DRESSINGS) IMPLANT
ELECT REM PT RETURN 9FT ADLT (ELECTROSURGICAL) ×1
ELECTRODE REM PT RTRN 9FT ADLT (ELECTROSURGICAL) ×1 IMPLANT
GLOVE BIO SURGEON STRL SZ 6.5 (GLOVE) ×1 IMPLANT
GLOVE BIOGEL PI IND STRL 6.5 (GLOVE) ×1 IMPLANT
GLOVE BIOGEL PI IND STRL 7.0 (GLOVE) ×2 IMPLANT
GOWN STRL REUS W/TWL LRG LVL3 (GOWN DISPOSABLE) ×3 IMPLANT
HEMOSTAT SNOW SURGICEL 2X4 (HEMOSTASIS) ×1 IMPLANT
INST SET LAPROSCOPIC AP (KITS) ×1 IMPLANT
KIT TURNOVER KIT A (KITS) ×1 IMPLANT
MANIFOLD NEPTUNE II (INSTRUMENTS) ×1 IMPLANT
NDL INSUFFLATION 14GA 120MM (NEEDLE) ×1 IMPLANT
NEEDLE INSUFFLATION 14GA 120MM (NEEDLE) ×1 IMPLANT
NS IRRIG 1000ML POUR BTL (IV SOLUTION) ×1 IMPLANT
PACK LAP CHOLE LZT030E (CUSTOM PROCEDURE TRAY) ×1 IMPLANT
PAD ARMBOARD 7.5X6 YLW CONV (MISCELLANEOUS) ×1 IMPLANT
SET BASIN LINEN APH (SET/KITS/TRAYS/PACK) ×1 IMPLANT
SET TUBE SMOKE EVAC HIGH FLOW (TUBING) ×1 IMPLANT
SLEEVE Z-THREAD 5X100MM (TROCAR) ×1 IMPLANT
SUT MNCRL AB 4-0 PS2 18 (SUTURE) ×2 IMPLANT
SUT VICRYL 0 UR6 27IN ABS (SUTURE) ×1 IMPLANT
SYS BAG RETRIEVAL 10MM (BASKET) ×1
SYSTEM BAG RETRIEVAL 10MM (BASKET) ×1 IMPLANT
TROCAR Z-THRD FIOS HNDL 11X100 (TROCAR) ×1 IMPLANT
TROCAR Z-THREAD FIOS 5X100MM (TROCAR) ×1 IMPLANT
TROCAR Z-THREAD SLEEVE 11X100 (TROCAR) ×1 IMPLANT
TUBE CONNECTING 12X1/4 (SUCTIONS) ×1 IMPLANT
WARMER LAPAROSCOPE (MISCELLANEOUS) ×1 IMPLANT

## 2022-01-16 NOTE — Anesthesia Preprocedure Evaluation (Addendum)
Anesthesia Evaluation  Patient identified by MRN, date of birth, ID band Patient awake    Reviewed: Allergy & Precautions, H&P , NPO status , Patient's Chart, lab work & pertinent test results, reviewed documented beta blocker date and time   Airway Mallampati: II  TM Distance: >3 FB Neck ROM: full    Dental no notable dental hx.    Pulmonary asthma ,    Pulmonary exam normal breath sounds clear to auscultation       Cardiovascular Exercise Tolerance: Good negative cardio ROS   Rhythm:regular Rate:Normal     Neuro/Psych PSYCHIATRIC DISORDERS Anxiety negative neurological ROS     GI/Hepatic negative GI ROS, Neg liver ROS,   Endo/Other  Hypothyroidism Morbid obesity  Renal/GU negative Renal ROS  negative genitourinary   Musculoskeletal   Abdominal   Peds  Hematology negative hematology ROS (+)   Anesthesia Other Findings   Reproductive/Obstetrics negative OB ROS                            Anesthesia Physical Anesthesia Plan  ASA: 3  Anesthesia Plan: General and General ETT   Post-op Pain Management:    Induction:   PONV Risk Score and Plan: Scopolamine patch - Pre-op and Ondansetron  Airway Management Planned:   Additional Equipment:   Intra-op Plan:   Post-operative Plan:   Informed Consent: I have reviewed the patients History and Physical, chart, labs and discussed the procedure including the risks, benefits and alternatives for the proposed anesthesia with the patient or authorized representative who has indicated his/her understanding and acceptance.     Dental Advisory Given  Plan Discussed with: CRNA  Anesthesia Plan Comments:        Anesthesia Quick Evaluation

## 2022-01-16 NOTE — Progress Notes (Signed)
[facility]  Updated mom. Rx to [facility].  Curlene Labrum, MD [facility] [address] [place], [place] [address] [phone] (office)

## 2022-01-16 NOTE — Op Note (Signed)
Operative Note   Preoperative Diagnosis: Symptomatic cholelithiasis   Postoperative Diagnosis: Same   Procedure(s) Performed: Laparoscopic cholecystectomy   Surgeon: Ria Comment C. Constance Haw, MD   Assistants: No qualified resident was available   Anesthesia: General endotracheal   Anesthesiologist: Louann Sjogren, MD    Specimens: Gallbladder    Estimated Blood Loss: Minimal    Blood Replacement: None    Complications: None    Operative Findings: Distended with stones    Procedure: The patient was taken to the operating room and placed supine. General endotracheal anesthesia was induced. Intravenous antibiotics were administered per protocol. An orogastric tube positioned to decompress the stomach. The abdomen was prepared and draped in the usual sterile fashion.    A supraumbilical incision was made and a Veress technique was utilized to achieve pneumoperitoneum to 15 mmHg with carbon dioxide. A 11 mm optiview port was placed through the supraumbilical region, and a 10 mm 0-degree operative laparoscope was introduced. The area underlying the trocar and Veress needle were inspected and without evidence of injury.  Remaining trocars were placed under direct vision. Two 5 mm ports were placed in the right abdomen, between the anterior axillary and midclavicular line.  A final 11 mm port was placed through the mid-epigastrium, near the falciform ligament.    The gallbladder fundus was elevated cephalad and the infundibulum was retracted to the patient's right. The gallbladder/cystic duct junction was skeletonized. The cystic artery noted in the triangle of Calot and was also skeletonized.  We then continued liberal medial and lateral dissection until the critical view of safety was achieved.    The cystic duct and cystic artery were doubly clipped and divided. The gallbladder was then dissected from the liver bed with electrocautery. The specimen was placed in an Endopouch and was retrieved  through the epigastric site.   Final inspection revealed acceptable hemostasis. Surgical SNOW was placed in the gallbladder bed.  Trocars were removed and pneumoperitoneum was released.  0 Vicryl fascial sutures were used to close the epigastric and umbilical port sites. Skin incisions were closed with 4-0 Monocryl subcuticular sutures and Dermabond. The patient was awakened from anesthesia and extubated without complication.    Curlene Labrum, MD [facility] [address] [place], [place] [address] [phone] (office)

## 2022-01-16 NOTE — Transfer of Care (Signed)
Immediate Anesthesia Transfer of Care Note  Patient: Tamara Carroll  Procedure(s) Performed: LAPAROSCOPIC CHOLECYSTECTOMY (Abdomen)  Patient Location: PACU  Anesthesia Type:General  Level of Consciousness: awake  Airway & Oxygen Therapy: Patient Spontanous Breathing  Post-op Assessment: Report given to RN  Post vital signs: Reviewed and stable  Last Vitals:  Vitals Value Taken Time  BP 126/62 01/16/22 1030  Temp 37.1 C 01/16/22 0943  Pulse 89 01/16/22 1036  Resp 15 01/16/22 1036  SpO2 100 % 01/16/22 1036  Vitals shown include unvalidated device data.  Last Pain:  Vitals:   01/16/22 1015  PainSc: 4       Patients Stated Pain Goal: 7 (00/37/04 8889)  Complications: No notable events documented.

## 2022-01-16 NOTE — Anesthesia Postprocedure Evaluation (Signed)
Anesthesia Post Note  Patient: Tamara Carroll  Procedure(s) Performed: LAPAROSCOPIC CHOLECYSTECTOMY (Abdomen)  Patient location during evaluation: Phase II Anesthesia Type: General Level of consciousness: awake Pain management: pain level controlled Vital Signs Assessment: post-procedure vital signs reviewed and stable Respiratory status: spontaneous breathing and respiratory function stable Cardiovascular status: blood pressure returned to baseline and stable Postop Assessment: no headache and no apparent nausea or vomiting Anesthetic complications: no Comments: Late entry   No notable events documented.   Last Vitals:  Vitals:   01/16/22 1030 01/16/22 1051  BP: 126/62 125/64  Pulse: 92 90  Resp: 19 20  Temp:  36.9 C  SpO2: 100% 100%    Last Pain:  Vitals:   01/16/22 1051  TempSrc: Oral  PainSc: [place]

## 2022-01-16 NOTE — Discharge Instructions (Signed)
Discharge Laparoscopic Surgery Instructions:  Common Complaints: Right shoulder pain is common after laparoscopic surgery. This is secondary to the gas used in the surgery being trapped under the diaphragm.  Walk to help your body absorb the gas. This will improve in a few days. Pain at the port sites are common, especially the larger port sites. This will improve with time.  Some nausea is common and poor appetite. The main goal is to stay hydrated the first few days after surgery.   Diet/ Activity: Diet as tolerated. You may not have an appetite, but it is important to stay hydrated. Drink 64 ounces of water a day. Your appetite will return with time.  Shower per your regular routine daily.  Do not take hot showers. Take warm showers that are less than 10 minutes. Rest and listen to your body, but do not remain in bed all day.  Walk everyday for at least 15-20 minutes. Deep cough and move around every 1-2 hours in the first few days after surgery.  Do not lift > 10 lbs, perform excessive bending, pushing, pulling, squatting for 1-2 weeks after surgery.  Do not pick at the dermabond glue on your incision sites.  This glue film will remain in place for 1-2 weeks and will start to peel off.  Do not place lotions or balms on your incision unless instructed to specifically by Dr. Helina Hullum.   Pain Expectations and Narcotics: -After surgery you will have pain associated with your incisions and this is normal. The pain is muscular and nerve pain, and will get better with time. -You are encouraged and expected to take non narcotic medications like tylenol and ibuprofen (when able) to treat pain as multiple modalities can aid with pain treatment. -Narcotics are only used when pain is severe or there is breakthrough pain. -You are not expected to have a pain score of 0 after surgery, as we cannot prevent pain. A pain score of 3-4 that allows you to be functional, move, walk, and tolerate some activity is  the goal. The pain will continue to improve over the days after surgery and is dependent on your surgery. -Due to [place] law, we are only able to give a certain amount of pain medication to treat post operative pain, and we only give additional narcotics on a patient by patient basis.  -For most laparoscopic surgery, studies have shown that the majority of patients only need 10-15 narcotic pills, and for open surgeries most patients only need 15-20.   -Having appropriate expectations of pain and knowledge of pain management with non narcotics is important as we do not want anyone to become addicted to narcotic pain medication.  -Using ice packs in the first 48 hours and heating pads after 48 hours, wearing an abdominal binder (when recommended), and using over the counter medications are all ways to help with pain management.   -Simple acts like meditation and mindfulness practices after surgery can also help with pain control and research has proven the benefit of these practices.  Medication: Take tylenol and ibuprofen as needed for pain control, alternating every 4-6 hours.  Example:  Tylenol 1000mg @ 6am, 12noon, 6pm, 12midnight (Do not exceed 4000mg of tylenol a day). Ibuprofen 800mg @ 9am, 3pm, 9pm, 3am (Do not exceed 3600mg of ibuprofen a day).  Take Roxicodone for breakthrough pain every 4 hours.  Take Colace for constipation related to narcotic pain medication. If you do not have a bowel movement in 2 days, take Miralax   over the counter.  Drink plenty of water to also prevent constipation.   Contact Information: If you have questions or concerns, please call our office, [phone], Monday- Thursday 8AM-5PM and Friday 8AM-12Noon.  If it is after hours or on the weekend, please call [facility] Main Number, [phone], [phone], and ask to speak to the surgeon on call for Dr. Santa Abdelrahman at [place].   

## 2022-01-16 NOTE — Interval H&P Note (Signed)
History and Physical Interval Note:  01/16/2022 8:17 AM  Tamara Carroll  has presented today for surgery, with the diagnosis of CHOLELITHIASIS.  The various methods of treatment have been discussed with the patient and family. After consideration of risks, benefits and other options for treatment, the patient has consented to  Procedure(s): LAPAROSCOPIC CHOLECYSTECTOMY (N/A) as a surgical intervention.  The patient's history has been reviewed, patient examined, no change in status, stable for surgery.  I have reviewed the patient's chart and labs.  Questions were answered to the patient's satisfaction.     Virl Cagey

## 2022-01-16 NOTE — Anesthesia Procedure Notes (Signed)
Procedure Name: Intubation Date/Time: 01/16/2022 8:34 AM  Performed by: Ollen Bowl, CRNAPre-anesthesia Checklist: Patient identified, Patient being monitored, Timeout performed, Emergency Drugs available and Suction available Patient Re-evaluated:Patient Re-evaluated prior to induction Oxygen Delivery Method: Circle system utilized Preoxygenation: Pre-oxygenation with 100% oxygen Induction Type: IV induction Ventilation: Mask ventilation without difficulty Laryngoscope Size: Mac and 3 Grade View: Grade I Tube type: Oral Tube size: 7.0 mm Number of attempts: 1 Airway Equipment and Method: Stylet Placement Confirmation: ETT inserted through vocal cords under direct vision, positive ETCO2 and breath sounds checked- equal and bilateral Secured at: 20 cm Tube secured with: Tape Dental Injury: Teeth and Oropharynx as per pre-operative assessment

## 2022-01-17 LAB — SURGICAL PATHOLOGY

## 2022-01-20 DIAGNOSIS — S86012A Strain of left Achilles tendon, initial encounter: Secondary | ICD-10-CM | POA: Diagnosis not present

## 2022-01-20 DIAGNOSIS — M79672 Pain in left foot: Secondary | ICD-10-CM | POA: Diagnosis not present

## 2022-01-22 ENCOUNTER — Telehealth: Payer: Self-pay | Admitting: *Deleted

## 2022-01-22 NOTE — Telephone Encounter (Signed)
Received STD forms from [employer]~ [phone]~ telephone/ [phone]~ fax.  Received work release forms from [employer] [phone] fax.   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

## 2022-01-24 ENCOUNTER — Encounter (HOSPITAL_COMMUNITY): Payer: Self-pay | Admitting: General Surgery

## 2022-01-30 ENCOUNTER — Ambulatory Visit (INDEPENDENT_AMBULATORY_CARE_PROVIDER_SITE_OTHER): Payer: BC Managed Care – PPO | Admitting: General Surgery

## 2022-01-30 DIAGNOSIS — K8020 Calculus of gallbladder without cholecystitis without obstruction: Secondary | ICD-10-CM

## 2022-01-30 NOTE — Progress Notes (Signed)
[facility]  I am calling the patient for post operative evaluation. This is not a billable encounter as it is under the [place] charges for the surgery.  The patient had a laparoscopic cholecystectomy on 01/16/22. The patient reports that she is doing well. The are tolerating a diet, having good pain control, and having regular Bms.  The incisions are healing. The patient has no concerns. She has been back to work.   Pathology: FINAL MICROSCOPIC DIAGNOSIS:   A. GALLBLADDER, CHOLECYSTECTOMY:  Chronic cholecystitis with cholelithiasis   Will see the patient PRN.  Activity and diet as tolerated.   Curlene Labrum, MD [facility] [address] [place], [place] [address] [phone] (office)

## 2022-04-01 DIAGNOSIS — F32A Depression, unspecified: Secondary | ICD-10-CM | POA: Diagnosis not present

## 2022-04-01 DIAGNOSIS — E669 Obesity, unspecified: Secondary | ICD-10-CM | POA: Diagnosis not present

## 2022-04-01 DIAGNOSIS — E039 Hypothyroidism, unspecified: Secondary | ICD-10-CM | POA: Diagnosis not present

## 2022-04-01 DIAGNOSIS — J301 Allergic rhinitis due to pollen: Secondary | ICD-10-CM | POA: Diagnosis not present

## 2022-06-03 DIAGNOSIS — Z6841 Body Mass Index (BMI) 40.0 and over, adult: Secondary | ICD-10-CM | POA: Diagnosis not present

## 2022-06-03 DIAGNOSIS — M26602 Left temporomandibular joint disorder, unspecified: Secondary | ICD-10-CM | POA: Diagnosis not present

## 2022-07-22 DIAGNOSIS — Z6841 Body Mass Index (BMI) 40.0 and over, adult: Secondary | ICD-10-CM | POA: Diagnosis not present

## 2022-07-22 DIAGNOSIS — J40 Bronchitis, not specified as acute or chronic: Secondary | ICD-10-CM | POA: Diagnosis not present

## 2022-08-04 DIAGNOSIS — Z79899 Other long term (current) drug therapy: Secondary | ICD-10-CM | POA: Diagnosis not present

## 2022-08-04 DIAGNOSIS — E039 Hypothyroidism, unspecified: Secondary | ICD-10-CM | POA: Diagnosis not present

## 2023-03-05 ENCOUNTER — Encounter (INDEPENDENT_AMBULATORY_CARE_PROVIDER_SITE_OTHER): Payer: Self-pay | Admitting: *Deleted

## 2023-03-26 DIAGNOSIS — T7840XA Allergy, unspecified, initial encounter: Secondary | ICD-10-CM | POA: Diagnosis not present

## 2023-03-26 DIAGNOSIS — K130 Diseases of lips: Secondary | ICD-10-CM | POA: Diagnosis not present

## 2023-07-31 DIAGNOSIS — E860 Dehydration: Secondary | ICD-10-CM | POA: Diagnosis not present

## 2023-07-31 DIAGNOSIS — R112 Nausea with vomiting, unspecified: Secondary | ICD-10-CM | POA: Diagnosis not present

## 2023-07-31 DIAGNOSIS — Z91041 Radiographic dye allergy status: Secondary | ICD-10-CM | POA: Diagnosis not present

## 2023-07-31 DIAGNOSIS — R42 Dizziness and giddiness: Secondary | ICD-10-CM | POA: Diagnosis not present

## 2023-07-31 DIAGNOSIS — Z79899 Other long term (current) drug therapy: Secondary | ICD-10-CM | POA: Diagnosis not present

## 2023-07-31 DIAGNOSIS — R509 Fever, unspecified: Secondary | ICD-10-CM | POA: Diagnosis not present

## 2023-07-31 DIAGNOSIS — F419 Anxiety disorder, unspecified: Secondary | ICD-10-CM | POA: Diagnosis not present

## 2023-07-31 DIAGNOSIS — D72819 Decreased white blood cell count, unspecified: Secondary | ICD-10-CM | POA: Diagnosis not present

## 2023-07-31 DIAGNOSIS — Z20822 Contact with and (suspected) exposure to covid-19: Secondary | ICD-10-CM | POA: Diagnosis not present

## 2023-07-31 DIAGNOSIS — R0981 Nasal congestion: Secondary | ICD-10-CM | POA: Diagnosis not present

## 2023-07-31 DIAGNOSIS — J45909 Unspecified asthma, uncomplicated: Secondary | ICD-10-CM | POA: Diagnosis not present

## 2023-07-31 DIAGNOSIS — R197 Diarrhea, unspecified: Secondary | ICD-10-CM | POA: Diagnosis not present

## 2023-07-31 DIAGNOSIS — R519 Headache, unspecified: Secondary | ICD-10-CM | POA: Diagnosis not present

## 2023-07-31 DIAGNOSIS — Z888 Allergy status to other drugs, medicaments and biological substances status: Secondary | ICD-10-CM | POA: Diagnosis not present

## 2023-07-31 DIAGNOSIS — Z91040 Latex allergy status: Secondary | ICD-10-CM | POA: Diagnosis not present

## 2023-07-31 DIAGNOSIS — R059 Cough, unspecified: Secondary | ICD-10-CM | POA: Diagnosis not present

## 2023-07-31 DIAGNOSIS — Z880 Allergy status to penicillin: Secondary | ICD-10-CM | POA: Diagnosis not present

## 2023-08-10 DIAGNOSIS — N3001 Acute cystitis with hematuria: Secondary | ICD-10-CM | POA: Diagnosis not present

## 2023-08-10 DIAGNOSIS — R350 Frequency of micturition: Secondary | ICD-10-CM | POA: Diagnosis not present

## 2023-08-10 DIAGNOSIS — Z91041 Radiographic dye allergy status: Secondary | ICD-10-CM | POA: Diagnosis not present

## 2023-08-10 DIAGNOSIS — Z880 Allergy status to penicillin: Secondary | ICD-10-CM | POA: Diagnosis not present

## 2023-08-10 DIAGNOSIS — Z79890 Hormone replacement therapy (postmenopausal): Secondary | ICD-10-CM | POA: Diagnosis not present

## 2023-08-10 DIAGNOSIS — M545 Low back pain, unspecified: Secondary | ICD-10-CM | POA: Diagnosis not present

## 2023-08-10 DIAGNOSIS — Z91040 Latex allergy status: Secondary | ICD-10-CM | POA: Diagnosis not present

## 2023-08-10 DIAGNOSIS — R1032 Left lower quadrant pain: Secondary | ICD-10-CM | POA: Diagnosis not present

## 2023-09-09 ENCOUNTER — Encounter (HOSPITAL_COMMUNITY): Payer: Self-pay

## 2023-09-09 ENCOUNTER — Emergency Department (HOSPITAL_COMMUNITY)
Admission: EM | Admit: 2023-09-09 | Discharge: 2023-09-09 | Disposition: A | Discharge status: Home / Self Care | Payer: Self-pay | Attending: Emergency Medicine | Admitting: Emergency Medicine

## 2023-09-09 ENCOUNTER — Other Ambulatory Visit: Payer: Self-pay

## 2023-09-09 DIAGNOSIS — B029 Zoster without complications: Secondary | ICD-10-CM | POA: Diagnosis not present

## 2023-09-09 DIAGNOSIS — Z91040 Latex allergy status: Secondary | ICD-10-CM | POA: Diagnosis not present

## 2023-09-09 DIAGNOSIS — E039 Hypothyroidism, unspecified: Secondary | ICD-10-CM | POA: Diagnosis not present

## 2023-09-09 DIAGNOSIS — R21 Rash and other nonspecific skin eruption: Secondary | ICD-10-CM | POA: Diagnosis not present

## 2023-09-09 MED ORDER — VALACYCLOVIR HCL 1 G PO TABS: 1000.0000 mg | ORAL_TABLET | Freq: Three times a day (TID) | ORAL | 0 refills | Status: AC

## 2023-09-09 MED ORDER — OXYCODONE-ACETAMINOPHEN 5-325 MG PO TABS: 1.0000 | ORAL_TABLET | Freq: Four times a day (QID) | ORAL | 0 refills | Status: DC | PRN

## 2023-09-09 MED ORDER — METHYLPREDNISOLONE 4 MG PO TBPK: ORAL_TABLET | Freq: Every day | ORAL | 0 refills | Status: AC

## 2023-09-09 NOTE — ED Provider Notes (Signed)
 [place] EMERGENCY DEPARTMENT AT [facility] Provider Note   CSN: [id number] Arrival date & time: 09/09/23  1649     History  Chief Complaint  Patient presents with   Herpes Zoster    Tamara Carroll is a 28 y.o. female.  Pt is a 28 yo female with pmhx significant for anxiety, hypothyroidism and multiple episodes of chicken pox and shingles.  Pt said she has not had shingles in about a year, but feels like she is getting them again.  She developed a rash and pain yesterday.  She said she called her doctor and was told she could not get an appt for a month.  She also said that her doctor had told her that she needed to be on valtrex  for life and that she could not get the shingles shot because she was too young.  She has a rash on the left side of her chest.        Home Medications Prior to Admission medications   Medication Sig Start Date End Date Taking? Authorizing Provider  methylPREDNISolone (MEDROL DOSEPAK) 4 MG TBPK tablet Take by mouth daily. Day 1:  2 pills at breakfast, 1 pill at lunch, 1 pill after supper, 2 pills at bedtime;Day 2:  1 pill at breakfast, 1 pill at lunch, 1 pill after supper, 2 pills at bedtime;Day 3:  1 pill at breakfast, 1 pill at lunch, 1 pill after supper, 1 pill at bedtime;Day 4:  1 pill at breakfast, 1 pill at lunch, 1 pill at bedtime;Day 5:  1 pill at breakfast, 1 pill at bedtime;Day 6:  1 pill at breakfast 09/09/23  Yes Goran Olden, MD  oxyCODONE -acetaminophen (PERCOCET/ROXICET) 5-325 MG tablet Take 1 tablet by mouth every 6 (six) hours as needed for severe pain (pain score 7-10). 09/09/23  Yes Sueellen Emery, MD  valACYclovir  (VALTREX ) 1000 MG tablet Take 1 tablet (1,000 mg total) by mouth 3 (three) times daily. 09/09/23  Yes Kayveon Lennartz, MD  albuterol (VENTOLIN HFA) 108 (90 Base) MCG/ACT inhaler Inhale 1 puff into the lungs as needed. 10/01/15   [provider]  diazepam (VALIUM) 5 MG tablet Insert 1 tablet (5 mg total) into  the vagina nightly as needed for muscle spasms (30-60 minutes prior to ancipated intercourse.) 11/21/21   [provider]  ibuprofen  (ADVIL ) 200 MG tablet Take 200 mg by mouth every 6 (six) hours as needed.    [provider]  levothyroxine (SYNTHROID) 25 MCG tablet Take by mouth. 07/26/21 07/26/22  [provider]  Omega-3 Fatty Acids (FISH OIL) 1000 MG CPDR Take by mouth. 2000 mg qd    [provider]  sertraline (ZOLOFT) 100 MG tablet Take 2 tablets by mouth daily. 05/19/18   [provider]      Allergies    Peanut oil, Penicillins, Latex, and Other    Review of Systems   Review of Systems  Skin:  Positive for rash.  All other systems reviewed and are negative.   Physical Exam Updated Vital Signs BP 119/65 (BP Location: Right Arm)   Pulse (!) 58   Temp 98.1 F (36.7 C) (Oral)   Resp 16   Ht 4\' 11"  (1.499 m)   Wt 90.7 kg   LMP 08/30/2023 (Exact Date)   SpO2 100%   BMI 40.40 kg/m  Physical Exam Vitals and nursing note reviewed.  Constitutional:      Appearance: Normal appearance.  HENT:     Head: Normocephalic and atraumatic.  Right Ear: External ear normal.     Left Ear: External ear normal.     Nose: Nose normal.     Mouth/Throat:     Mouth: Mucous membranes are moist.     Pharynx: Oropharynx is clear.  Eyes:     Extraocular Movements: Extraocular movements intact.     Conjunctiva/sclera: Conjunctivae normal.     Pupils: Pupils are equal, round, and reactive to light.  Cardiovascular:     Rate and Rhythm: Normal rate and regular rhythm.     Pulses: Normal pulses.     Heart sounds: Normal heart sounds.  Pulmonary:     Effort: Pulmonary effort is normal.     Breath sounds: Normal breath sounds.  Abdominal:     General: Abdomen is flat. Bowel sounds are normal.     Palpations: Abdomen is soft.  Musculoskeletal:        General: Normal range of motion.     Cervical back: Normal range of motion and neck supple.   Skin:    Capillary Refill: Capillary refill takes less than 2 seconds.     Comments: Shingles rash upper left chest  Neurological:     General: No focal deficit present.     Mental Status: She is alert and oriented to person, place, and time.  Psychiatric:        Mood and Affect: Mood normal.        Behavior: Behavior normal.     ED Results / Procedures / Treatments   Labs (all labs ordered are listed, but only abnormal results are displayed) Labs Reviewed - No data to display  EKG None  Radiology No results found.  Procedures Procedures    Medications Ordered in ED Medications - No data to display  ED Course/ Medical Decision Making/ A&P                                 Medical Decision Making Risk Prescription drug management.   This patient presents to the ED for concern of rash, this involves an extensive number of treatment options, and is a complaint that carries with it a high risk of complications and morbidity.  The differential diagnosis includes shingles, dermatitis   Co morbidities that complicate the patient evaluation  anxiety, hypothyroidism and multiple episodes of chicken pox and shingles   Additional history obtained:  Additional history obtained from epic chart review  Medicines ordered and prescription drug management:   I have reviewed the patients home medicines and have made adjustments as needed   Problem List / ED Course:  Herpes Zoster:  pt has a typical rash with sx of the same in the past.  She is d/c with valtrex , medrol dose pack, and percocet.  She is referred to [facility] due to the frequent infections.  She is stable for d/c.  Return if worse.    Social Determinants of Health:  Lives at home   Dispostion:  After consideration of the diagnostic results and the patients response to treatment, I feel that the patent would benefit from discharge with outpatient f/u.          Final Clinical Impression(s) / ED  Diagnoses Final diagnoses:  Herpes zoster without complication    Rx / DC Orders ED Discharge Orders          Ordered    Ambulatory referral to Infectious Disease       Comments:  Recurrent shingles   09/09/23 1723    valACYclovir  (VALTREX ) 1000 MG tablet  3 times daily        09/09/23 1726    methylPREDNISolone (MEDROL DOSEPAK) 4 MG TBPK tablet  Daily        09/09/23 1726    oxyCODONE -acetaminophen (PERCOCET/ROXICET) 5-325 MG tablet  Every 6 hours PRN        09/09/23 1726              Heli Dino, MD 09/09/23 1733

## 2023-09-09 NOTE — ED Triage Notes (Signed)
 Pt reports she has had shingles in the past and believes she has them again on her left chest.

## 2023-10-24 DIAGNOSIS — R350 Frequency of micturition: Secondary | ICD-10-CM | POA: Diagnosis not present

## 2023-10-24 DIAGNOSIS — L209 Atopic dermatitis, unspecified: Secondary | ICD-10-CM | POA: Diagnosis not present

## 2024-02-10 ENCOUNTER — Encounter (HOSPITAL_BASED_OUTPATIENT_CLINIC_OR_DEPARTMENT_OTHER): Payer: Self-pay | Admitting: Orthopaedic Surgery

## 2024-02-17 ENCOUNTER — Ambulatory Visit (HOSPITAL_BASED_OUTPATIENT_CLINIC_OR_DEPARTMENT_OTHER)
Admission: RE | Admit: 2024-02-17 | Payer: PRIVATE HEALTH INSURANCE | Source: Home / Self Care | Admitting: Orthopaedic Surgery

## 2024-02-17 DIAGNOSIS — Z419 Encounter for procedure for purposes other than remedying health state, unspecified: Secondary | ICD-10-CM

## 2024-02-17 SURGERY — ARTHROSCOPY, ANKLE WITH DEBRIDEMENT
Anesthesia: General | Laterality: Left
